# Patient Record
Sex: Female | Born: 1975
Health system: Southern US, Community
[De-identification: ages and names within clinical notes are randomized; demographics above are authoritative.]

## PROBLEM LIST (undated history)

## (undated) DIAGNOSIS — T7840XA Allergy, unspecified, initial encounter: Secondary | ICD-10-CM

## (undated) DIAGNOSIS — F319 Bipolar disorder, unspecified: Secondary | ICD-10-CM

## (undated) DIAGNOSIS — K439 Ventral hernia without obstruction or gangrene: Secondary | ICD-10-CM

## (undated) DIAGNOSIS — E785 Hyperlipidemia, unspecified: Secondary | ICD-10-CM

## (undated) DIAGNOSIS — F32A Depression, unspecified: Secondary | ICD-10-CM

## (undated) DIAGNOSIS — F329 Major depressive disorder, single episode, unspecified: Secondary | ICD-10-CM

## (undated) HISTORY — DX: Bipolar disorder, unspecified: F31.9

## (undated) HISTORY — DX: Hyperlipidemia, unspecified: E78.5

## (undated) HISTORY — DX: Depression, unspecified: F32.A

## (undated) HISTORY — DX: Allergy, unspecified, initial encounter: T78.40XA

## (undated) HISTORY — PX: HAND SURGERY: SHX662

## (undated) HISTORY — PX: OTHER SURGICAL HISTORY: SHX169

## (undated) HISTORY — DX: Major depressive disorder, single episode, unspecified: F32.9

---

## 1999-07-22 DIAGNOSIS — L719 Rosacea, unspecified: Secondary | ICD-10-CM | POA: Insufficient documentation

## 2000-11-09 ENCOUNTER — Other Ambulatory Visit: Admission: RE | Admit: 2000-11-09 | Discharge: 2000-11-09 | Payer: Self-pay | Admitting: *Deleted

## 2000-11-09 DIAGNOSIS — F329 Major depressive disorder, single episode, unspecified: Secondary | ICD-10-CM | POA: Insufficient documentation

## 2000-11-09 DIAGNOSIS — F32A Depression, unspecified: Secondary | ICD-10-CM | POA: Insufficient documentation

## 2006-06-02 DIAGNOSIS — F319 Bipolar disorder, unspecified: Secondary | ICD-10-CM | POA: Insufficient documentation

## 2008-05-13 ENCOUNTER — Ambulatory Visit: Payer: Self-pay | Admitting: Internal Medicine

## 2008-05-22 ENCOUNTER — Ambulatory Visit: Payer: Self-pay | Admitting: Internal Medicine

## 2008-06-10 ENCOUNTER — Ambulatory Visit: Payer: Self-pay | Admitting: Internal Medicine

## 2008-08-10 ENCOUNTER — Ambulatory Visit: Payer: Self-pay | Admitting: Internal Medicine

## 2008-09-04 ENCOUNTER — Ambulatory Visit: Payer: Self-pay | Admitting: Internal Medicine

## 2008-09-10 ENCOUNTER — Ambulatory Visit: Payer: Self-pay | Admitting: Internal Medicine

## 2008-10-10 ENCOUNTER — Ambulatory Visit: Payer: Self-pay | Admitting: Internal Medicine

## 2008-12-11 ENCOUNTER — Ambulatory Visit: Payer: Self-pay | Admitting: Internal Medicine

## 2008-12-26 ENCOUNTER — Ambulatory Visit: Payer: Self-pay | Admitting: Internal Medicine

## 2009-01-10 ENCOUNTER — Ambulatory Visit: Payer: Self-pay | Admitting: Internal Medicine

## 2009-06-10 ENCOUNTER — Ambulatory Visit: Payer: Self-pay | Admitting: Internal Medicine

## 2009-06-26 ENCOUNTER — Ambulatory Visit: Payer: Self-pay | Admitting: Internal Medicine

## 2009-07-11 ENCOUNTER — Ambulatory Visit: Payer: Self-pay | Admitting: Internal Medicine

## 2009-12-11 ENCOUNTER — Ambulatory Visit: Payer: Self-pay | Admitting: Internal Medicine

## 2009-12-31 ENCOUNTER — Ambulatory Visit: Payer: Self-pay | Admitting: Internal Medicine

## 2010-01-10 ENCOUNTER — Ambulatory Visit: Payer: Self-pay | Admitting: Internal Medicine

## 2011-01-01 ENCOUNTER — Ambulatory Visit: Payer: Self-pay | Admitting: Internal Medicine

## 2011-01-11 ENCOUNTER — Ambulatory Visit: Payer: Self-pay | Admitting: Internal Medicine

## 2014-03-01 ENCOUNTER — Ambulatory Visit: Payer: BC Managed Care – PPO

## 2014-03-01 ENCOUNTER — Ambulatory Visit (INDEPENDENT_AMBULATORY_CARE_PROVIDER_SITE_OTHER): Payer: BC Managed Care – PPO | Admitting: Podiatry

## 2014-03-01 ENCOUNTER — Ambulatory Visit (INDEPENDENT_AMBULATORY_CARE_PROVIDER_SITE_OTHER): Payer: BC Managed Care – PPO

## 2014-03-01 ENCOUNTER — Encounter: Payer: Self-pay | Admitting: Podiatry

## 2014-03-01 VITALS — BP 113/70 | HR 60 | Resp 16 | Ht 66.0 in | Wt 138.0 lb

## 2014-03-01 DIAGNOSIS — L600 Ingrowing nail: Secondary | ICD-10-CM

## 2014-03-01 DIAGNOSIS — M779 Enthesopathy, unspecified: Secondary | ICD-10-CM

## 2014-03-01 DIAGNOSIS — M7660 Achilles tendinitis, unspecified leg: Secondary | ICD-10-CM

## 2014-03-01 NOTE — Patient Instructions (Addendum)
ANTIBACTERIAL SOAP INSTRUCTIONS  THE DAY AFTER PROCEDURE  Please follow the instructions your doctor has marked.   Shower as usual. Before getting out, place a drop of antibacterial liquid soap (Dial) on a wet, clean washcloth.  Gently wipe washcloth over affected area.  Afterward, rinse the area with warm water.  Blot the area dry with a soft cloth and cover with antibiotic ointment (neosporin, polysporin, bacitracin) and band aid or gauze and tape  Place 3-4 drops of antibacterial liquid soap in a quart of warm tap water.  Submerge foot into water for 20 minutes.  If bandage was applied after your procedure, leave on to allow for easy lift off, then remove and continue with soak for the remaining time.  Next, blot area dry with a soft cloth and cover with a bandage.  Apply other medications as directed by your doctor, such as cortisporin otic solution (eardrops) or neosporin antibiotic ointment  Achilles Tendinitis  with Rehab Achilles tendinitis is a disorder of the Achilles tendon. The Achilles tendon connects the large calf muscles (Gastrocnemius and Soleus) to the heel bone (calcaneus). This tendon is sometimes called the heel cord. It is important for pushing-off and standing on your toes and is important for walking, running, or jumping. Tendinitis is often caused by overuse and repetitive microtrauma. SYMPTOMS  Pain, tenderness, swelling, warmth, and redness may occur over the Achilles tendon even at rest.  Pain with pushing off, or flexing or extending the ankle.  Pain that is worsened after or during activity. CAUSES   Overuse sometimes seen with rapid increase in exercise programs or in sports requiring running and jumping.  Poor physical conditioning (strength and flexibility or endurance).  Running sports, especially training running down hills.  Inadequate warm-up before practice or play or failure to stretch before participation.  Injury to the tendon. PREVENTION    Warm up and stretch before practice or competition.  Allow time for adequate rest and recovery between practices and competition.  Keep up conditioning.  Keep up ankle and leg flexibility.  Improve or keep muscle strength and endurance.  Improve cardiovascular fitness.  Use proper technique.  Use proper equipment (shoes, skates).  To help prevent recurrence, taping, protective strapping, or an adhesive bandage may be recommended for several weeks after healing is complete. PROGNOSIS   Recovery may take weeks to several months to heal.  Longer recovery is expected if symptoms have been prolonged.  Recovery is usually quicker if the inflammation is due to a direct blow as compared with overuse or sudden strain. RELATED COMPLICATIONS   Healing time will be prolonged if the condition is not correctly treated. The injury must be given plenty of time to heal.  Symptoms can reoccur if activity is resumed too soon.  Untreated, tendinitis may increase the risk of tendon rupture requiring additional time for recovery and possibly surgery. TREATMENT   The first treatment consists of rest anti-inflammatory medication, and ice to relieve the pain.  Stretching and strengthening exercises after resolution of pain will likely help reduce the risk of recurrence. Referral to a physical therapist or athletic trainer for further evaluation and treatment may be helpful.  A walking boot or cast may be recommended to rest the Achilles tendon. This can help break the cycle of inflammation and microtrauma.  Arch supports (orthotics) may be prescribed or recommended by your caregiver as an adjunct to therapy and rest.  Surgery to remove the inflamed tendon lining or degenerated tendon tissue is rarely necessary  and has shown less than predictable results. MEDICATION   Nonsteroidal anti-inflammatory medications, such as aspirin and ibuprofen, may be used for pain and inflammation relief. Do not  take within 7 days before surgery. Take these as directed by your caregiver. Contact your caregiver immediately if any bleeding, stomach upset, or signs of allergic reaction occur. Other minor pain relievers, such as acetaminophen, may also be used.  Pain relievers may be prescribed as necessary by your caregiver. Do not take prescription pain medication for longer than 4 to 7 days. Use only as directed and only as much as you need.  Cortisone injections are rarely indicated. Cortisone injections may weaken tendons and predispose to rupture. It is better to give the condition more time to heal than to use them. HEAT AND COLD  Cold is used to relieve pain and reduce inflammation for acute and chronic Achilles tendinitis. Cold should be applied for 10 to 15 minutes every 2 to 3 hours for inflammation and pain and immediately after any activity that aggravates your symptoms. Use ice packs or an ice massage.  Heat may be used before performing stretching and strengthening activities prescribed by your caregiver. Use a heat pack or a warm soak. SEEK MEDICAL CARE IF:  Symptoms get worse or do not improve in 2 weeks despite treatment.  New, unexplained symptoms develop. Drugs used in treatment may produce side effects.  EXERCISES:  RANGE OF MOTION (ROM) AND STRETCHING EXERCISES - Achilles Tendinitis  These exercises may help you when beginning to rehabilitate your injury. Your symptoms may resolve with or without further involvement from your physician, physical therapist or athletic trainer. While completing these exercises, remember:   Restoring tissue flexibility helps normal motion to return to the joints. This allows healthier, less painful movement and activity.  An effective stretch should be held for at least 30 seconds.  A stretch should never be painful. You should only feel a gentle lengthening or release in the stretched tissue.  STRETCH  Gastroc, Standing   Place hands on  wall.  Extend right / left leg, keeping the front knee somewhat bent.  Slightly point your toes inward on your back foot.  Keeping your right / left heel on the floor and your knee straight, shift your weight toward the wall, not allowing your back to arch.  You should feel a gentle stretch in the right / left calf. Hold this position for 10 seconds. Repeat 3 times. Complete this stretch 2 times per day.  STRETCH  Soleus, Standing   Place hands on wall.  Extend right / left leg, keeping the other knee somewhat bent.  Slightly point your toes inward on your back foot.  Keep your right / left heel on the floor, bend your back knee, and slightly shift your weight over the back leg so that you feel a gentle stretch deep in your back calf.  Hold this position for 10 seconds. Repeat 3 times. Complete this stretch 2 times per day.  STRETCH  Gastrocsoleus, Standing  Note: This exercise can place a lot of stress on your foot and ankle. Please complete this exercise only if specifically instructed by your caregiver.   Place the ball of your right / left foot on a step, keeping your other foot firmly on the same step.  Hold on to the wall or a rail for balance.  Slowly lift your other foot, allowing your body weight to press your heel down over the edge of the step.  You should feel a stretch in your right / left calf.  Hold this position for 10 seconds.  Repeat this exercise with a slight bend in your knee. Repeat 3 times. Complete this stretch 2 times per day.   STRENGTHENING EXERCISES - Achilles Tendinitis These exercises may help you when beginning to rehabilitate your injury. They may resolve your symptoms with or without further involvement from your physician, physical therapist or athletic trainer. While completing these exercises, remember:   Muscles can gain both the endurance and the strength needed for everyday activities through controlled exercises.  Complete these  exercises as instructed by your physician, physical therapist or athletic trainer. Progress the resistance and repetitions only as guided.  You may experience muscle soreness or fatigue, but the pain or discomfort you are trying to eliminate should never worsen during these exercises. If this pain does worsen, stop and make certain you are following the directions exactly. If the pain is still present after adjustments, discontinue the exercise until you can discuss the trouble with your clinician.  STRENGTH - Plantar-flexors   Sit with your right / left leg extended. Holding onto both ends of a rubber exercise band/tubing, loop it around the ball of your foot. Keep a slight tension in the band.  Slowly push your toes away from you, pointing them downward.  Hold this position for 10 seconds. Return slowly, controlling the tension in the band/tubing. Repeat 3 times. Complete this exercise 2 times per day.   STRENGTH - Plantar-flexors   Stand with your feet shoulder width apart. Steady yourself with a wall or table using as little support as needed.  Keeping your weight evenly spread over the width of your feet, rise up on your toes.*  Hold this position for 10 seconds. Repeat 3 times. Complete this exercise 2 times per day.  *If this is too easy, shift your weight toward your right / left leg until you feel challenged. Ultimately, you may be asked to do this exercise with your right / left foot only.  STRENGTH  Plantar-flexors, Eccentric  Note: This exercise can place a lot of stress on your foot and ankle. Please complete this exercise only if specifically instructed by your caregiver.   Place the balls of your feet on a step. With your hands, use only enough support from a wall or rail to keep your balance.  Keep your knees straight and rise up on your toes.  Slowly shift your weight entirely to your right / left toes and pick up your opposite foot. Gently and with controlled movement,  lower your weight through your right / left foot so that your heel drops below the level of the step. You will feel a slight stretch in the back of your calf at the end position.  Use the healthy leg to help rise up onto the balls of both feet, then lower weight only on the right / left leg again. Build up to 15 repetitions. Then progress to 3 consecutive sets of 15 repetitions.*  After completing the above exercise, complete the same exercise with a slight knee bend (about 30 degrees). Again, build up to 15 repetitions. Then progress to 3 consecutive sets of 15 repetitions.* Perform this exercise 2 times per day.  *When you easily complete 3 sets of 15, your physician, physical therapist or athletic trainer may advise you to add resistance by wearing a backpack filled with additional weight.  STRENGTH - Plantar Flexors, Seated   Sit on a  chair that allows your feet to rest flat on the ground. If necessary, sit at the edge of the chair.  Keeping your toes firmly on the ground, lift your right / left heel as far as you can without increasing any discomfort in your ankle. Repeat 3 times. Complete this exercise 2 times a day.

## 2014-03-01 NOTE — Progress Notes (Signed)
   Subjective:    Patient ID: Rebecca Powers, female    DOB: December 20, 1975, 38 y.o.   MRN: [id number]  HPI Comments: i have 2 spots on the bottom of my left foot. They do not hurt, they feel uncomfortable. Ive had them for 2 months. They are getting bigger. When im walking i feel it. The 2nd toenail on my rt foot keeps coming off. i have lost it 2 - 3 times over a period of 1.5 yrs. It only happens after i do long runs. The last time i ran my race it did not fall off. i dont do anything for my feet.  Foot Pain      Review of Systems  All other systems reviewed and are negative.      Objective:   Physical Exam        Assessment & Plan:

## 2014-03-02 NOTE — Progress Notes (Signed)
Subjective:     Patient ID: Rebecca Powers, female   DOB: Nov 21, 1975, 38 y.o.   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      Objective:   Physical Exam  Constitutional: She is oriented to person, place, and time.  Cardiovascular: Intact distal pulses.   Musculoskeletal: Normal range of motion.  Neurological: She is oriented to person, place, and time.  Skin: Skin is warm.  Nursing note and vitals reviewed.  neurovascular status intact with muscle strength adequate range of motion of the subtalar and midtarsal joint within normal limits. Patient is noted to have small lesions plantar aspect left second metatarsal that are separate and is found to have a damaged discolored nails second right that is loose and painful when pressed from a dorsal direction. She is well oriented 3 has good digital perfusion and no equinus condition was noted     Assessment:     Damaged second nail right with moderate tendinitis-like condition and keratotic lesion sub-second left which are probable porokeratosis    Plan:     H&P and conditions discussed. I do think the consideration is there for removal of the nail and I explained the procedure and risk. She wants this done and today I infiltrated 60 mg Xylocaine Marcaine mixture remove the second nail exposed the bed and applied phenol 3 applications 30 seconds followed by alcohol lavaged and sterile dressings. I also discussed her x-rays and we reviewed moderate bunion deformity and Achilles tendinitis and I gave her instructions on exercises for the Achilles tendon

## 2015-05-30 ENCOUNTER — Encounter: Payer: Self-pay | Admitting: Physician Assistant

## 2015-05-30 ENCOUNTER — Ambulatory Visit (INDEPENDENT_AMBULATORY_CARE_PROVIDER_SITE_OTHER): Payer: BLUE CROSS/BLUE SHIELD | Admitting: Physician Assistant

## 2015-05-30 VITALS — BP 110/80 | HR 79 | Temp 100.4°F | Resp 16 | Wt 141.4 lb

## 2015-05-30 DIAGNOSIS — D696 Thrombocytopenia, unspecified: Secondary | ICD-10-CM | POA: Insufficient documentation

## 2015-05-30 DIAGNOSIS — J309 Allergic rhinitis, unspecified: Secondary | ICD-10-CM | POA: Insufficient documentation

## 2015-05-30 DIAGNOSIS — A084 Viral intestinal infection, unspecified: Secondary | ICD-10-CM

## 2015-05-30 DIAGNOSIS — E559 Vitamin D deficiency, unspecified: Secondary | ICD-10-CM | POA: Insufficient documentation

## 2015-05-30 DIAGNOSIS — N920 Excessive and frequent menstruation with regular cycle: Secondary | ICD-10-CM | POA: Insufficient documentation

## 2015-05-30 DIAGNOSIS — R110 Nausea: Secondary | ICD-10-CM | POA: Diagnosis not present

## 2015-05-30 DIAGNOSIS — R6889 Other general symptoms and signs: Secondary | ICD-10-CM

## 2015-05-30 DIAGNOSIS — E7800 Pure hypercholesterolemia, unspecified: Secondary | ICD-10-CM | POA: Insufficient documentation

## 2015-05-30 HISTORY — DX: Thrombocytopenia, unspecified: D69.6

## 2015-05-30 LAB — POCT INFLUENZA A/B
Influenza A, POC: NEGATIVE
Influenza B, POC: NEGATIVE

## 2015-05-30 MED ORDER — ONDANSETRON HCL 4 MG PO TABS
4.0000 mg | ORAL_TABLET | Freq: Three times a day (TID) | ORAL | Status: DC | PRN
Start: 2015-05-30 — End: 2017-06-02

## 2015-05-30 NOTE — Progress Notes (Signed)
Patient: Rebecca Powers Female    DOB: [birth year]   40 y.o.   MRN: [id number] Visit Date: 05/30/2015  Today's Provider: Mar Daring, PA-C   Chief Complaint  Patient presents with  . Emesis  . Diarrhea   Subjective:    HPI  Rebecca Powers is here today c/o Vomiting,diarrhea-watery went to the bathroom five to six times last night. Vomiting and diarrhea is gone since this morning. Feeling fatigue, low grade fever-did not check temperature last night but did this morning and it was at 99.0,chills and aching all over. She took Ibuprofen at 2 pm before coming to the appointment. No sick contact that she know off. Recently returned yesterday from a trip to [place], [place]. Had been on many planes and spent 7 days at a conference.     No Known Allergies Previous Medications   AZELAIC ACID (FINACEA) 15 % CREAM    Apply topically daily. After skin is thoroughly washed and patted dry, gently but thoroughly massage a thin film of azelaic acid cream into the affected area twice daily, in the morning and evening.   BRIMONIDINE TARTRATE (MIRVASO) 0.33 % GEL    Apply topically daily.   LAMOTRIGINE (LAMICTAL) 100 MG TABLET       PREVIFEM 0.25-35 MG-MCG TABLET        Review of Systems  Constitutional: Positive for fever, chills and fatigue.  HENT: Negative.  Negative for congestion, postnasal drip, rhinorrhea and sinus pressure.   Eyes: Negative.   Respiratory: Negative.  Negative for cough, chest tightness, shortness of breath and wheezing.   Cardiovascular: Negative for chest pain, palpitations and leg swelling.  Musculoskeletal: Positive for myalgias.  Neurological: Negative for dizziness and headaches.    Social History  Substance Use Topics  . Smoking status: Never Smoker   . Smokeless tobacco: Not on file  . Alcohol Use: No   Objective:   BP 110/80 mmHg  Pulse 79  Temp(Src) 100.4 F (38 C) (Oral)  Resp 16  Wt 141 lb 6.4 oz (64.139 kg)  SpO2 99%  LMP  05/05/2015  Physical Exam  Constitutional: She is oriented to person, place, and time. She appears well-developed and well-nourished. No distress.  Cardiovascular: Normal rate, regular rhythm and normal heart sounds.  Exam reveals no gallop and no friction rub.   No murmur heard. Pulmonary/Chest: Effort normal and breath sounds normal. No respiratory distress. She has no wheezes. She has no rales.  Abdominal: Soft. Normal appearance and bowel sounds are normal. She exhibits no distension and no mass. There is no hepatosplenomegaly. There is generalized tenderness. There is no rebound, no guarding and no CVA tenderness.  Suprapubic tenderness  Neurological: She is alert and oriented to person, place, and time.  Skin: Skin is warm and dry. She is not diaphoretic.        Assessment & Plan:     1. Flu-like symptoms Flu test was negative. - POCT Influenza A/B  2. Viral gastroenteritis Improving. Will give zofran as needed for nausea as below in case it returns.  Advised to stay well hydrated. She is to call the office if symptoms do not improve or worsen. - ondansetron (ZOFRAN) 4 MG tablet; Take 1 tablet (4 mg total) by mouth every 8 (eight) hours as needed for nausea or vomiting.  Dispense: 20 tablet; Refill: 0  3. Nausea See above medical treatment plan. - ondansetron (ZOFRAN) 4 MG tablet; Take 1 tablet (4 mg total) by mouth  every 8 (eight) hours as needed for nausea or vomiting.  Dispense: 20 tablet; Refill: 0       Mar Daring, PA-C  [facility]

## 2015-05-30 NOTE — Patient Instructions (Signed)

## 2015-06-23 ENCOUNTER — Other Ambulatory Visit: Payer: Self-pay | Admitting: Family Medicine

## 2015-06-23 DIAGNOSIS — N921 Excessive and frequent menstruation with irregular cycle: Secondary | ICD-10-CM

## 2015-09-22 DIAGNOSIS — F3181 Bipolar II disorder: Secondary | ICD-10-CM | POA: Diagnosis not present

## 2015-12-20 DIAGNOSIS — F3181 Bipolar II disorder: Secondary | ICD-10-CM | POA: Diagnosis not present

## 2015-12-30 DIAGNOSIS — L814 Other melanin hyperpigmentation: Secondary | ICD-10-CM | POA: Diagnosis not present

## 2015-12-30 DIAGNOSIS — L718 Other rosacea: Secondary | ICD-10-CM | POA: Diagnosis not present

## 2015-12-30 DIAGNOSIS — Z1283 Encounter for screening for malignant neoplasm of skin: Secondary | ICD-10-CM | POA: Diagnosis not present

## 2015-12-30 DIAGNOSIS — D229 Melanocytic nevi, unspecified: Secondary | ICD-10-CM | POA: Diagnosis not present

## 2016-06-18 ENCOUNTER — Telehealth: Payer: Self-pay

## 2016-06-18 DIAGNOSIS — Z3041 Encounter for surveillance of contraceptive pills: Secondary | ICD-10-CM

## 2016-06-18 MED ORDER — NORGESTIMATE-ETH ESTRADIOL 0.25-35 MG-MCG PO TABS: ORAL_TABLET | ORAL | 3 refills | Status: DC

## 2016-06-18 NOTE — Telephone Encounter (Signed)
Previfem birth control refilled and sent to [facility]

## 2016-06-18 NOTE — Telephone Encounter (Signed)
[facility] mail service pharmacy requesting refill for [facility] tablets 28S Last ov 05/30/15  Please review. Thank you. sd

## 2016-09-13 DIAGNOSIS — F3181 Bipolar II disorder: Secondary | ICD-10-CM | POA: Diagnosis not present

## 2016-09-15 ENCOUNTER — Other Ambulatory Visit: Payer: Self-pay

## 2016-09-15 DIAGNOSIS — Z0000 Encounter for general adult medical examination without abnormal findings: Secondary | ICD-10-CM

## 2016-09-15 LAB — POCT URINALYSIS DIPSTICK
BILIRUBIN UA: NEGATIVE
GLUCOSE UA: NEGATIVE
Ketones, UA: NEGATIVE
Leukocytes, UA: NEGATIVE
Nitrite, UA: NEGATIVE
RBC UA: NEGATIVE
SPEC GRAV UA: 1.01 (ref 1.010–1.025)
Urobilinogen, UA: 0.2 E.U./dL
pH, UA: 6.5 (ref 5.0–8.0)

## 2016-09-16 LAB — CMP12+LP+TP+TSH+6AC+CBC/D/PLT
ALT: 17 IU/L (ref 0–32)
AST: 22 IU/L (ref 0–40)
Albumin/Globulin Ratio: 1.5 (ref 1.2–2.2)
Albumin: 4.2 g/dL (ref 3.5–5.5)
Alkaline Phosphatase: 41 IU/L (ref 39–117)
BASOS ABS: 0 10*3/uL (ref 0.0–0.2)
BILIRUBIN TOTAL: 0.5 mg/dL (ref 0.0–1.2)
BUN/Creatinine Ratio: 11 (ref 9–23)
BUN: 10 mg/dL (ref 6–24)
Basos: 0 %
CHOLESTEROL TOTAL: 212 mg/dL — AB (ref 100–199)
Calcium: 9.4 mg/dL (ref 8.7–10.2)
Chloride: 102 mmol/L (ref 96–106)
Chol/HDL Ratio: 3 ratio (ref 0.0–4.4)
Creatinine, Ser: 0.94 mg/dL (ref 0.57–1.00)
EOS (ABSOLUTE): 0.1 10*3/uL (ref 0.0–0.4)
Eos: 1 %
Estimated CHD Risk: <0.5 times avg. (ref 0.0–1.0)
FREE THYROXINE INDEX: 1.9 (ref 1.2–4.9)
GFR calc non Af Amer: 76 mL/min/{1.73_m2} (ref >59)
GFR, EST AFRICAN AMERICAN: 88 mL/min/{1.73_m2} (ref >59)
GGT: 39 IU/L (ref 0–60)
GLOBULIN, TOTAL: 2.8 g/dL (ref 1.5–4.5)
GLUCOSE: 78 mg/dL (ref 65–99)
HDL: 70 mg/dL (ref >39)
HEMOGLOBIN: 13.5 g/dL (ref 11.1–15.9)
Hematocrit: 41.5 % (ref 34.0–46.6)
IMMATURE GRANULOCYTES: 0 %
IRON: 101 ug/dL (ref 27–159)
Immature Grans (Abs): 0 10*3/uL (ref 0.0–0.1)
LDH: 156 IU/L (ref 119–226)
LDL Calculated: 120 mg/dL — ABNORMAL HIGH (ref 0–99)
LYMPHS: 26 %
Lymphocytes Absolute: 1.3 10*3/uL (ref 0.7–3.1)
MCH: 30.9 pg (ref 26.6–33.0)
MCHC: 32.5 g/dL (ref 31.5–35.7)
MCV: 95 fL (ref 79–97)
MONOCYTES: 6 %
Monocytes Absolute: 0.3 10*3/uL (ref 0.1–0.9)
NEUTROS PCT: 67 %
Neutrophils Absolute: 3.3 10*3/uL (ref 1.4–7.0)
PHOSPHORUS: 3.4 mg/dL (ref 2.5–4.5)
PLATELETS: 168 10*3/uL (ref 150–379)
Potassium: 4.5 mmol/L (ref 3.5–5.2)
RBC: 4.37 x10E6/uL (ref 3.77–5.28)
RDW: 12.7 % (ref 12.3–15.4)
Sodium: 140 mmol/L (ref 134–144)
T3 UPTAKE RATIO: 20 % — AB (ref 24–39)
T4 TOTAL: 9.4 ug/dL (ref 4.5–12.0)
TOTAL PROTEIN: 7 g/dL (ref 6.0–8.5)
TRIGLYCERIDES: 109 mg/dL (ref 0–149)
TSH: 4.88 u[IU]/mL — ABNORMAL HIGH (ref 0.450–4.500)
Uric Acid: 4.4 mg/dL (ref 2.5–7.1)
VLDL CHOLESTEROL CAL: 22 mg/dL (ref 5–40)
WBC: 4.9 10*3/uL (ref 3.4–10.8)

## 2016-09-16 LAB — VITAMIN D 25 HYDROXY (VIT D DEFICIENCY, FRACTURES): Vit D, 25-Hydroxy: 34.7 ng/mL (ref 30.0–100.0)

## 2016-10-05 ENCOUNTER — Encounter: Payer: Self-pay | Admitting: Medical

## 2016-10-05 ENCOUNTER — Ambulatory Visit: Payer: Self-pay | Admitting: Medical

## 2016-10-05 VITALS — BP 94/64 | HR 69 | Temp 99.0°F | Resp 16 | Ht 66.0 in | Wt 140.0 lb

## 2016-10-05 DIAGNOSIS — Z23 Encounter for immunization: Secondary | ICD-10-CM

## 2016-10-05 DIAGNOSIS — Z0000 Encounter for general adult medical examination without abnormal findings: Secondary | ICD-10-CM

## 2016-10-05 DIAGNOSIS — E559 Vitamin D deficiency, unspecified: Secondary | ICD-10-CM

## 2016-10-05 NOTE — Progress Notes (Signed)
Subjective:    Patient ID: Rebecca Powers, female    DOB: 17-Sep-1975, 41 y.o.   MRN: [id number]  HPI Here for primary care appointment , no complaints today.  Previously seeing Margarita Rana at [facility].  Sees Dr. Ria Clock at the [facility] who manages her Lamictal. Single , currently building her a new house would like her Tdap updated.   Review of Systems  Constitutional: Negative.   HENT: Positive for postnasal drip.   Eyes: Negative.   Respiratory: Negative.   Cardiovascular: Negative.   Gastrointestinal: Negative.   Endocrine: Negative.   Genitourinary: Negative.   Musculoskeletal: Negative.   Skin: Negative.   Allergic/Immunologic: Positive for environmental allergies. Negative for food allergies and immunocompromised state.  Neurological: Negative.   Hematological: Negative.   Psychiatric/Behavioral: Negative.    Diagnosed with thrombocytopenia though current labs are low normal.  Right index base of finger cyst on volar surface. Patient currently does not want to see any specialtist at this time. Objective:   Physical Exam  Constitutional: She is oriented to person, place, and time. Vital signs are normal. She appears well-developed and well-nourished.  HENT:  Head: Normocephalic and atraumatic.  Right Ear: Hearing, external ear and ear canal normal. A middle ear effusion is present.  Left Ear: Hearing, external ear and ear canal normal. A middle ear effusion is present.  Nose: Nose normal.  Mouth/Throat: Uvula is midline, oropharynx is clear and moist and mucous membranes are normal.  Eyes: Conjunctivae, EOM and lids are normal. Pupils are equal, round, and reactive to light.  Fundoscopic exam:      The right eye shows red reflex.       The left eye shows red reflex.  Neck: Trachea normal and normal range of motion. Neck supple. No thyromegaly present.  Cardiovascular: Normal rate, regular rhythm, normal heart sounds and intact distal pulses.  Exam  reveals no gallop and no friction rub.   No murmur heard. Pulmonary/Chest: Effort normal and breath sounds normal. Right breast exhibits no inverted nipple, no mass, no nipple discharge and no skin change. Left breast exhibits no inverted nipple, no mass, no nipple discharge, no skin change and no tenderness. Breasts are symmetrical.  Abdominal: Soft. Normal aorta and bowel sounds are normal. There is no hepatosplenomegaly.  Musculoskeletal: Normal range of motion.  Lymphadenopathy:       Head (right side): No submental, no submandibular, no tonsillar, no preauricular, no posterior auricular and no occipital adenopathy present.       Head (left side): No submental, no submandibular, no tonsillar, no preauricular, no posterior auricular and no occipital adenopathy present.    She has no cervical adenopathy.       Right cervical: No superficial cervical, no deep cervical and no posterior cervical adenopathy present.      Left cervical: No superficial cervical, no deep cervical and no posterior cervical adenopathy present.       Right axillary: No pectoral and no lateral adenopathy present.       Left axillary: No pectoral and no lateral adenopathy present.      Right: No supraclavicular adenopathy present.       Left: No supraclavicular adenopathy present.  Neurological: She is alert and oriented to person, place, and time. She has normal strength and normal reflexes. She displays a negative Romberg sign. GCS eye subscore is 4. GCS verbal subscore is 5. GCS motor subscore is 6.  Reflex Scores:      Brachioradialis  reflexes are 2+ on the right side and 2+ on the left side.      Patellar reflexes are 2+ on the right side and 2+ on the left side.      Achilles reflexes are 2+ on the right side and 2+ on the left side. Skin: Skin is warm, dry and intact.  Psychiatric: She has a normal mood and affect. Her speech is normal and behavior is normal. Judgment and thought content normal. Cognition and  memory are normal.  Nursing note and vitals reviewed.  Breast exam performed. wnl.  Right hand base of index finger on volar surface is a cyst. Currently she does not wan to see a specialist about it. When she is ready to please contact me for referral.     Assessment & Plan:  Primary care appointment. Elevated cholesterol and  LDL , counseled patient on diet and exercise, she feels she eats healthy. Slightly Elevated TSH will monitor recheck in  3 months, patient denies any fatigue. Vitamin D low recommended OTC Vitamin D3 1000 IU-2000 IU/day recheck in  3 months and sunshine if possible 15 minutes a day then use sunscreen and a hat. She runs and now that the weather is better she will be doing more running. Tdap updated.  Return to the clinic as needed.

## 2017-01-10 DIAGNOSIS — Z1283 Encounter for screening for malignant neoplasm of skin: Secondary | ICD-10-CM | POA: Diagnosis not present

## 2017-01-10 DIAGNOSIS — L718 Other rosacea: Secondary | ICD-10-CM | POA: Diagnosis not present

## 2017-01-10 DIAGNOSIS — D229 Melanocytic nevi, unspecified: Secondary | ICD-10-CM | POA: Diagnosis not present

## 2017-01-10 DIAGNOSIS — D1800 Hemangioma unspecified site: Secondary | ICD-10-CM | POA: Diagnosis not present

## 2017-03-31 DIAGNOSIS — F3181 Bipolar II disorder: Secondary | ICD-10-CM | POA: Diagnosis not present

## 2017-04-17 ENCOUNTER — Other Ambulatory Visit: Payer: Self-pay | Admitting: Physician Assistant

## 2017-04-17 DIAGNOSIS — Z3041 Encounter for surveillance of contraceptive pills: Secondary | ICD-10-CM

## 2017-04-22 ENCOUNTER — Telehealth: Payer: Self-pay

## 2017-04-22 NOTE — Telephone Encounter (Signed)
Contacted and spoke with patient about her request for a  refill on her birth control pill.  Patient unsure about her last annual pelvic exam but she will contact [facility] today to schedule an appointment. Once patient makes an appointment she is to let us know and at that point we will give her a 30 day bridge if she needs it. Pt  verbalizes understanding of instructions.

## 2017-05-30 ENCOUNTER — Ambulatory Visit: Payer: Self-pay | Admitting: Physician Assistant

## 2017-06-02 ENCOUNTER — Encounter: Payer: Self-pay | Admitting: Physician Assistant

## 2017-06-02 ENCOUNTER — Ambulatory Visit (INDEPENDENT_AMBULATORY_CARE_PROVIDER_SITE_OTHER): Payer: BLUE CROSS/BLUE SHIELD | Admitting: Physician Assistant

## 2017-06-02 ENCOUNTER — Other Ambulatory Visit: Payer: Self-pay

## 2017-06-02 VITALS — BP 110/70 | HR 74 | Temp 98.7°F | Resp 16 | Ht 66.0 in | Wt 144.8 lb

## 2017-06-02 DIAGNOSIS — Z1231 Encounter for screening mammogram for malignant neoplasm of breast: Secondary | ICD-10-CM | POA: Diagnosis not present

## 2017-06-02 DIAGNOSIS — Z3041 Encounter for surveillance of contraceptive pills: Secondary | ICD-10-CM

## 2017-06-02 DIAGNOSIS — Z1239 Encounter for other screening for malignant neoplasm of breast: Secondary | ICD-10-CM

## 2017-06-02 DIAGNOSIS — M67441 Ganglion, right hand: Secondary | ICD-10-CM

## 2017-06-02 MED ORDER — NORGESTIMATE-ETH ESTRADIOL 0.25-35 MG-MCG PO TABS: ORAL_TABLET | ORAL | 3 refills | Status: DC

## 2017-06-02 NOTE — Progress Notes (Signed)
Patient: Rebecca Powers, Female    DOB: Sep 04, 1975, 42 y.o.   MRN: [id number] Visit Date: 06/02/2017  Today's Provider: Mar Daring, PA-C   Chief Complaint  Patient presents with  . Annual Exam  . Discuss Birth Control   Subjective:    Annual physical exam Rebecca Powers is a 42 y.o. female who presents today for health maintenance and complete physical. She feels well. She reports exercising. She reports she is sleeping well.  Patient also here for refills on her birth control. She is not sexually active, nor has she ever been. She is awaiting marriage for sexual relationships.   She has never had a mammogram. No family history of breast cancer. Patient had breast exam in June 2018.  She also has a nodule on the right 4th finger MCP joint. Noticed a while back that has been increasing in size recently and now starting to cause pain.  -----------------------------------------------------------------   Review of Systems  Constitutional: Negative.   HENT: Positive for postnasal drip.   Eyes: Negative.   Respiratory: Negative.   Cardiovascular: Negative.   Gastrointestinal: Negative.   Endocrine: Negative.   Genitourinary: Negative.   Musculoskeletal: Negative.   Skin: Negative.   Allergic/Immunologic: Negative.   Neurological: Negative.   Hematological: Negative.   Psychiatric/Behavioral: Negative.     Social History      She  reports that  has never smoked. she has never used smokeless tobacco. She reports that she does not drink alcohol or use drugs.       Social History   Socioeconomic History  . Marital status: Single    Spouse name: None  . Number of children: None  . Years of education: None  . Highest education level: None  Social Needs  . Financial resource strain: None  . Food insecurity - worry: None  . Food insecurity - inability: None  . Transportation needs - medical: None  . Transportation needs - non-medical: None  Occupational  History  . None  Tobacco Use  . Smoking status: Never Smoker  . Smokeless tobacco: Never Used  Substance and Sexual Activity  . Alcohol use: No    Alcohol/week: 0.0 oz  . Drug use: No  . Sexual activity: No    Birth control/protection: Pill    Comment: ovarian cyst and ,excess bleeding mensis  Other Topics Concern  . None  Social History Narrative  . None    Past Medical History:  Diagnosis Date  . Allergy   . Bipolar disorder ([place])    Followed by Dr. Sheryle Hail  . Depression   . Hyperlipidemia      Patient Active Problem List   Diagnosis Date Noted  . Allergic rhinitis 05/30/2015  . Excess, menstruation 05/30/2015  . Pure hypercholesterolemia 05/30/2015  . Thrombocytopenia ([place]) 05/30/2015  . Avitaminosis D 05/30/2015  . Bipolar affective disorder ([place]) 06/02/2006  . Clinical depression 11/09/2000  . Acne erythematosa 07/22/1999    Past Surgical History:  Procedure Laterality Date  . none N/A     Family History        Family Status  Relation Name Status  . MGM  Deceased at age 33       Believed to have died from colon Cancer  . MGF  Deceased       CAD  . PGF  Deceased       parkinsons disease  . Mother  Alive  . Father  Alive  .  PGM  Deceased       Parkinson Disease, gastro issues,   . Brother  Alive  . Mat Aunt  Alive       unknown med hx  . Mat Nordstrom  . Ethlyn Daniels  Alive       unknown health hx  . Annamarie Major  Alive       unknown health hx        Her family history includes Dementia in her maternal grandfather; Heart disease in her maternal grandfather and paternal grandfather; Hyperlipidemia in her maternal grandfather, maternal uncle, mother, and paternal grandfather; Migraines in her brother.      No Known Allergies   Current Outpatient Medications:  .  Brimonidine Tartrate (MIRVASO) 0.33 % GEL, Apply topically daily., Disp: , Rfl:  .  lamoTRIgine (LAMICTAL) 100 MG tablet, , Disp: , Rfl: 2 .  norgestimate-ethinyl estradiol  (PREVIFEM) 0.25-35 MG-MCG tablet, TAKE 1 BY MOUTH DAILY, Disp: 84 tablet, Rfl: 3   Patient Care Team: Mar Daring, PA-C as PCP - General (Family Medicine)      Objective:   Vitals: BP 110/70 (BP Location: Left Arm, Patient Position: Sitting, Cuff Size: Normal)   Pulse 74   Temp 98.7 F (37.1 C) (Oral)   Resp 16   Ht 5\' 6"  (1.676 m)   Wt 144 lb 12.8 oz (65.7 kg)   BMI 23.37 kg/m    Physical Exam  Constitutional: She appears well-developed and well-nourished. No distress.  Neck: Normal range of motion. Neck supple. No JVD present. No tracheal deviation present. No thyromegaly present.  Cardiovascular: Normal rate, regular rhythm and normal heart sounds. Exam reveals no gallop and no friction rub.  No murmur heard. Pulmonary/Chest: Effort normal and breath sounds normal. No respiratory distress. She has no wheezes. She has no rales.  Lymphadenopathy:    She has no cervical adenopathy.  Skin: She is not diaphoretic.  Psychiatric: She has a normal mood and affect. Her behavior is normal. Judgment and thought content normal.  Vitals reviewed.    Depression Screen PHQ 2/9 Scores 06/02/2017  PHQ - 2 Score 0      Assessment & Plan:     Routine Health Maintenance and Physical Exam  Exercise Activities and Dietary recommendations Goals    None      Immunization History  Administered Date(s) Administered  . Hepatitis A 03/21/2009, 09/23/2009  . Influenza-Unspecified 01/03/2017  . Tdap 06/02/2006, 10/05/2016    Health Maintenance  Topic Date Due  . HIV Screening  06/02/1991  . PAP SMEAR  06/01/1997  . TETANUS/TDAP  10/06/2026  . INFLUENZA VACCINE  Completed     Discussed health benefits of physical activity, and encouraged her to engage in regular exercise appropriate for her age and condition.    1. Encounter for surveillance of contraceptive pills Stable. Diagnosis pulled for medication refill. Continue current medical treatment plan. -  norgestimate-ethinyl estradiol (PREVIFEM) 0.25-35 MG-MCG tablet; TAKE 1 BY MOUTH DAILY  Dispense: 84 tablet; Refill: 3  2. Breast cancer screening There is no family history of breast cancer. She does perform regular self breast exams. Mammogram was ordered as below. Information for [facility] was given to patient so she may schedule her mammogram at her convenience. - MM Digital Screening; Future  3. Ganglion cyst of finger of right hand Referral placed for possible ganglion cyst of right 4th finger. May be a nodule on tendon sheath but feels less likely and location more accurate  with ganglion cyst. - Ambulatory referral to Orthopedic Surgery  --------------------------------------------------------------------    Mar Daring, PA-C  [place]

## 2017-06-16 DIAGNOSIS — R2231 Localized swelling, mass and lump, right upper limb: Secondary | ICD-10-CM | POA: Diagnosis not present

## 2017-07-05 DIAGNOSIS — R2231 Localized swelling, mass and lump, right upper limb: Secondary | ICD-10-CM | POA: Diagnosis not present

## 2017-07-29 DIAGNOSIS — F329 Major depressive disorder, single episode, unspecified: Secondary | ICD-10-CM | POA: Diagnosis not present

## 2017-07-29 DIAGNOSIS — R2231 Localized swelling, mass and lump, right upper limb: Secondary | ICD-10-CM | POA: Diagnosis not present

## 2017-07-29 DIAGNOSIS — M67441 Ganglion, right hand: Secondary | ICD-10-CM | POA: Diagnosis not present

## 2017-07-29 DIAGNOSIS — M67449 Ganglion, unspecified hand: Secondary | ICD-10-CM | POA: Diagnosis not present

## 2017-07-29 DIAGNOSIS — R2241 Localized swelling, mass and lump, right lower limb: Secondary | ICD-10-CM | POA: Diagnosis not present

## 2017-09-21 ENCOUNTER — Ambulatory Visit
Admission: RE | Admit: 2017-09-21 | Discharge: 2017-09-21 | Disposition: A | Discharge status: Home / Self Care | Payer: BLUE CROSS/BLUE SHIELD | Source: Ambulatory Visit | Attending: Physician Assistant | Admitting: Physician Assistant

## 2017-09-21 DIAGNOSIS — Z1239 Encounter for other screening for malignant neoplasm of breast: Secondary | ICD-10-CM

## 2017-09-21 DIAGNOSIS — Z1231 Encounter for screening mammogram for malignant neoplasm of breast: Secondary | ICD-10-CM | POA: Insufficient documentation

## 2017-09-22 ENCOUNTER — Other Ambulatory Visit: Payer: Self-pay | Admitting: Physician Assistant

## 2017-09-22 DIAGNOSIS — R928 Other abnormal and inconclusive findings on diagnostic imaging of breast: Secondary | ICD-10-CM

## 2017-09-22 DIAGNOSIS — N6489 Other specified disorders of breast: Secondary | ICD-10-CM

## 2017-10-04 ENCOUNTER — Telehealth: Payer: Self-pay

## 2017-10-04 ENCOUNTER — Ambulatory Visit
Admission: RE | Admit: 2017-10-04 | Discharge: 2017-10-04 | Disposition: A | Discharge status: Home / Self Care | Payer: BLUE CROSS/BLUE SHIELD | Source: Ambulatory Visit | Attending: Physician Assistant | Admitting: Physician Assistant

## 2017-10-04 DIAGNOSIS — N6489 Other specified disorders of breast: Secondary | ICD-10-CM

## 2017-10-04 DIAGNOSIS — R928 Other abnormal and inconclusive findings on diagnostic imaging of breast: Secondary | ICD-10-CM

## 2017-10-04 DIAGNOSIS — R922 Inconclusive mammogram: Secondary | ICD-10-CM | POA: Diagnosis not present

## 2017-10-04 NOTE — Telephone Encounter (Signed)
Viewed by Alex Gardener on 10/04/2017 12:26 PM

## 2017-10-04 NOTE — Telephone Encounter (Signed)
-----   Message from Mar Daring, PA-C sent at 10/04/2017 11:11 AM EDT ----- Normal diagnostic mammogram. Return to normal screenings.

## 2017-10-17 ENCOUNTER — Ambulatory Visit (INDEPENDENT_AMBULATORY_CARE_PROVIDER_SITE_OTHER): Payer: BLUE CROSS/BLUE SHIELD | Admitting: Physician Assistant

## 2017-10-17 ENCOUNTER — Encounter: Payer: Self-pay | Admitting: Physician Assistant

## 2017-10-17 VITALS — BP 108/60 | HR 67 | Temp 98.4°F | Resp 16 | Ht 66.0 in | Wt 142.6 lb

## 2017-10-17 DIAGNOSIS — Z0000 Encounter for general adult medical examination without abnormal findings: Secondary | ICD-10-CM

## 2017-10-17 DIAGNOSIS — E7800 Pure hypercholesterolemia, unspecified: Secondary | ICD-10-CM

## 2017-10-17 DIAGNOSIS — Z124 Encounter for screening for malignant neoplasm of cervix: Secondary | ICD-10-CM | POA: Diagnosis not present

## 2017-10-17 DIAGNOSIS — Z131 Encounter for screening for diabetes mellitus: Secondary | ICD-10-CM

## 2017-10-17 NOTE — Progress Notes (Addendum)
Patient: Rebecca Powers, Female    DOB: March 02, 1976, 42 y.o.   MRN: [id number] Visit Date: 10/17/2017  Today's Provider: Mar Daring, PA-C   Chief Complaint  Patient presents with  . Annual Exam   Subjective:    Annual physical exam Rebecca Powers is a 42 y.o. female who presents today for health maintenance and complete physical. She feels well. She reports exercising 3 times a week. She reports she is sleeping well.  Patient is going to be traveling to [place] Jan 2020 and would like to discuss prescriptions/vaccination.  Review of Systems  Constitutional: Negative.   HENT: Positive for postnasal drip.   Eyes: Negative.   Respiratory: Negative.   Cardiovascular: Negative.   Gastrointestinal: Negative.   Endocrine: Negative.   Genitourinary: Negative.   Musculoskeletal: Negative.   Skin: Negative.   Allergic/Immunologic: Negative.   Neurological: Negative.   Hematological: Negative.   Psychiatric/Behavioral: Negative.     Social History      She  reports that she has never smoked. She has never used smokeless tobacco. She reports that she does not drink alcohol or use drugs.       Social History   Socioeconomic History  . Marital status: Single    Spouse name: Not on file  . Number of children: Not on file  . Years of education: Not on file  . Highest education level: Not on file  Occupational History  . Not on file  Social Needs  . Financial resource strain: Not on file  . Food insecurity:    Worry: Not on file    Inability: Not on file  . Transportation needs:    Medical: Not on file    Non-medical: Not on file  Tobacco Use  . Smoking status: Never Smoker  . Smokeless tobacco: Never Used  Substance and Sexual Activity  . Alcohol use: No    Alcohol/week: 0.0 oz  . Drug use: No  . Sexual activity: Never    Birth control/protection: Pill    Comment: ovarian cyst and ,excess bleeding mensis  Lifestyle  . Physical activity:    Days per  week: Not on file    Minutes per session: Not on file  . Stress: Not on file  Relationships  . Social connections:    Talks on phone: Not on file    Gets together: Not on file    Attends religious service: Not on file    Active member of club or organization: Not on file    Attends meetings of clubs or organizations: Not on file    Relationship status: Not on file  Other Topics Concern  . Not on file  Social History Narrative  . Not on file    Past Medical History:  Diagnosis Date  . Allergy   . Bipolar disorder ([place])    Followed by Dr. Sheryle Hail  . Depression   . Hyperlipidemia      Patient Active Problem List   Diagnosis Date Noted  . Allergic rhinitis 05/30/2015  . Excess, menstruation 05/30/2015  . Pure hypercholesterolemia 05/30/2015  . Thrombocytopenia ([place]) 05/30/2015  . Avitaminosis D 05/30/2015  . Bipolar affective disorder ([place]) 06/02/2006  . Clinical depression 11/09/2000  . Acne erythematosa 07/22/1999    Past Surgical History:  Procedure Laterality Date  . none N/A     Family History        Family Status  Relation Name Status  . MGM  Deceased at  age 3       Believed to have died from colon Cancer  . MGF  Deceased       CAD  . PGF  Deceased       parkinsons disease  . Mother  Alive  . Father  Alive  . PGM  Deceased       Parkinson Disease, gastro issues,   . Brother  Alive  . Mat Aunt  Alive       unknown med hx  . Mat Nordstrom  . Ethlyn Daniels  Alive       unknown health hx  . Annamarie Major  Alive       unknown health hx        Her family history includes Dementia in her maternal grandfather; Heart disease in her maternal grandfather and paternal grandfather; Hyperlipidemia in her maternal grandfather, maternal uncle, mother, and paternal grandfather; Migraines in her brother.      No Known Allergies   Current Outpatient Medications:  .  Brimonidine Tartrate (MIRVASO) 0.33 % GEL, Apply topically daily., Disp: , Rfl:  .   lamoTRIgine (LAMICTAL) 100 MG tablet, , Disp: , Rfl: 2 .  norgestimate-ethinyl estradiol (PREVIFEM) 0.25-35 MG-MCG tablet, TAKE 1 BY MOUTH DAILY, Disp: 84 tablet, Rfl: 3   Patient Care Team: Mar Daring, PA-C as PCP - General (Family Medicine)      Objective:   Vitals: BP 108/60 (BP Location: Left Arm, Patient Position: Sitting, Cuff Size: Normal)   Pulse 67   Temp 98.4 F (36.9 C) (Oral)   Resp 16   Ht 5\' 6"  (1.676 m)   Wt 142 lb 9.6 oz (64.7 kg)   LMP 09/25/2017   BMI 23.02 kg/m    Vitals:   10/17/17 0815  BP: 108/60  Pulse: 67  Resp: 16  Temp: 98.4 F (36.9 C)  TempSrc: Oral  Weight: 142 lb 9.6 oz (64.7 kg)  Height: 5\' 6"  (1.676 m)     Physical Exam  Constitutional: She is oriented to person, place, and time. She appears well-developed and well-nourished. No distress.  HENT:  Head: Normocephalic and atraumatic.  Right Ear: Hearing, tympanic membrane, external ear and ear canal normal.  Left Ear: Hearing, tympanic membrane, external ear and ear canal normal.  Nose: Nose normal.  Mouth/Throat: Uvula is midline, oropharynx is clear and moist and mucous membranes are normal. No oropharyngeal exudate.  Eyes: Pupils are equal, round, and reactive to light. Conjunctivae and EOM are normal. Right eye exhibits no discharge. Left eye exhibits no discharge. No scleral icterus.  Neck: Normal range of motion. Neck supple. No JVD present. Carotid bruit is not present. No tracheal deviation present. No thyromegaly present.  Cardiovascular: Normal rate, regular rhythm, normal heart sounds and intact distal pulses. Exam reveals no gallop and no friction rub.  No murmur heard. Pulmonary/Chest: Effort normal and breath sounds normal. No respiratory distress. She has no wheezes. She has no rales. She exhibits no tenderness. Right breast exhibits no inverted nipple, no mass, no nipple discharge, no skin change and no tenderness. Left breast exhibits no inverted nipple, no mass, no  nipple discharge, no skin change and no tenderness. No breast tenderness, discharge or bleeding. Breasts are symmetrical.  Abdominal: Soft. Bowel sounds are normal. She exhibits no distension and no mass. There is no tenderness. There is no rebound and no guarding. Hernia confirmed negative in the right inguinal area and confirmed negative in the left inguinal area.  Genitourinary:  Rectum normal, vagina normal and uterus normal. No breast tenderness, discharge or bleeding. Pelvic exam was performed with patient supine. There is no rash, tenderness, lesion or injury on the right labia. There is no rash, tenderness, lesion or injury on the left labia. Cervix exhibits no motion tenderness, no discharge and no friability. Right adnexum displays no mass, no tenderness and no fullness. Left adnexum displays no mass, no tenderness and no fullness. No erythema, tenderness or bleeding in the vagina. No signs of injury around the vagina. No vaginal discharge found.  Musculoskeletal: Normal range of motion. She exhibits no edema or tenderness.  Lymphadenopathy:    She has no cervical adenopathy.       Right: No inguinal adenopathy present.       Left: No inguinal adenopathy present.  Neurological: She is alert and oriented to person, place, and time. She has normal reflexes. No cranial nerve deficit. Coordination normal.  Skin: Skin is warm and dry. No rash noted. She is not diaphoretic.  Psychiatric: She has a normal mood and affect. Her behavior is normal. Judgment and thought content normal.  Vitals reviewed.    Depression Screen PHQ 2/9 Scores 10/17/2017 06/02/2017  PHQ - 2 Score 0 0      Assessment & Plan:     Routine Health Maintenance and Physical Exam  Exercise Activities and Dietary recommendations Goals    None      Immunization History  Administered Date(s) Administered  . Hepatitis A 03/21/2009, 09/23/2009  . Influenza-Unspecified 01/03/2017  . Tdap 06/02/2006, 10/05/2016     Health Maintenance  Topic Date Due  . HIV Screening  06/02/1991  . PAP SMEAR  06/01/1997  . INFLUENZA VACCINE  11/10/2017  . TETANUS/TDAP  10/06/2026     Discussed health benefits of physical activity, and encouraged her to engage in regular exercise appropriate for her age and condition.    1. Annual physical exam Normal physical exam today. Will check labs as below and f/u pending lab results. If labs are stable and WNL she will not need to have these rechecked for one year at her next annual physical exam. She is to call the office in the meantime if she has any acute issue, questions or concerns. - CBC with Differential/Platelet - Comprehensive metabolic panel - TSH  2. Cervical cancer screening Pap collected today. Will send as below and f/u pending results. - Pap IG and HPV (high risk) DNA detection  3. Pure hypercholesterolemia Will check labs as below and f/u pending results. - Lipid panel  4. Encounter for screening for diabetes mellitus Will check labs as below and f/u pending results. - Hemoglobin A1c  --------------------------------------------------------------------    Mar Daring, PA-C  [facility]

## 2017-10-17 NOTE — Patient Instructions (Signed)

## 2017-10-19 ENCOUNTER — Other Ambulatory Visit: Payer: Self-pay

## 2017-10-19 DIAGNOSIS — Z0000 Encounter for general adult medical examination without abnormal findings: Secondary | ICD-10-CM

## 2017-10-20 ENCOUNTER — Telehealth: Payer: Self-pay

## 2017-10-20 ENCOUNTER — Other Ambulatory Visit: Payer: Self-pay

## 2017-10-20 DIAGNOSIS — Z0000 Encounter for general adult medical examination without abnormal findings: Secondary | ICD-10-CM

## 2017-10-20 LAB — PAP IG AND HPV HIGH-RISK
HPV, high-risk: NEGATIVE
PAP SMEAR COMMENT: 0

## 2017-10-20 NOTE — Telephone Encounter (Signed)
Viewed by Alex Gardener on 10/20/2017 8:50 AM

## 2017-10-20 NOTE — Progress Notes (Signed)
Lab visit only no provider contact made

## 2017-10-20 NOTE — Telephone Encounter (Signed)
-----   Message from Mar Daring, PA-C sent at 10/20/2017  8:42 AM EDT ----- Pap is normal, HPV negative.  Will repeat in 3-5 years.

## 2017-10-21 ENCOUNTER — Telehealth: Payer: Self-pay

## 2017-10-21 LAB — CBC WITH DIFFERENTIAL/PLATELET
BASOS: 0 %
Basophils Absolute: 0 10*3/uL (ref 0.0–0.2)
EOS (ABSOLUTE): 0.1 10*3/uL (ref 0.0–0.4)
EOS: 1 %
HEMATOCRIT: 44.3 % (ref 34.0–46.6)
HEMOGLOBIN: 14.5 g/dL (ref 11.1–15.9)
IMMATURE GRANULOCYTES: 0 %
Immature Grans (Abs): 0 10*3/uL (ref 0.0–0.1)
Lymphocytes Absolute: 1.1 10*3/uL (ref 0.7–3.1)
Lymphs: 16 %
MCH: 30.8 pg (ref 26.6–33.0)
MCHC: 32.7 g/dL (ref 31.5–35.7)
MCV: 94 fL (ref 79–97)
MONOCYTES: 5 %
Monocytes Absolute: 0.4 10*3/uL (ref 0.1–0.9)
NEUTROS PCT: 78 %
Neutrophils Absolute: 5.2 10*3/uL (ref 1.4–7.0)
Platelets: 174 10*3/uL (ref 150–450)
RBC: 4.71 x10E6/uL (ref 3.77–5.28)
RDW: 13.1 % (ref 12.3–15.4)
WBC: 6.7 10*3/uL (ref 3.4–10.8)

## 2017-10-21 LAB — LIPID PANEL
CHOL/HDL RATIO: 2.9 ratio (ref 0.0–4.4)
CHOLESTEROL TOTAL: 230 mg/dL — AB (ref 100–199)
HDL: 80 mg/dL (ref >39)
LDL CALC: 131 mg/dL — AB (ref 0–99)
Triglycerides: 94 mg/dL (ref 0–149)
VLDL CHOLESTEROL CAL: 19 mg/dL (ref 5–40)

## 2017-10-21 LAB — COMPREHENSIVE METABOLIC PANEL
ALK PHOS: 39 IU/L (ref 39–117)
ALT: 17 IU/L (ref 0–32)
AST: 25 IU/L (ref 0–40)
Albumin/Globulin Ratio: 1.6 (ref 1.2–2.2)
Albumin: 4.6 g/dL (ref 3.5–5.5)
BUN/Creatinine Ratio: 11 (ref 9–23)
BUN: 10 mg/dL (ref 6–24)
Bilirubin Total: 0.6 mg/dL (ref 0.0–1.2)
CALCIUM: 9.7 mg/dL (ref 8.7–10.2)
CO2: 24 mmol/L (ref 20–29)
CREATININE: 0.94 mg/dL (ref 0.57–1.00)
Chloride: 101 mmol/L (ref 96–106)
GFR calc Af Amer: 87 mL/min/{1.73_m2} (ref >59)
GFR, EST NON AFRICAN AMERICAN: 76 mL/min/{1.73_m2} (ref >59)
GLUCOSE: 90 mg/dL (ref 65–99)
Globulin, Total: 2.9 g/dL (ref 1.5–4.5)
Potassium: 4.6 mmol/L (ref 3.5–5.2)
Sodium: 139 mmol/L (ref 134–144)
Total Protein: 7.5 g/dL (ref 6.0–8.5)

## 2017-10-21 LAB — TSH: TSH: 4.11 u[IU]/mL (ref 0.450–4.500)

## 2017-10-21 LAB — HGB A1C W/O EAG: Hgb A1c MFr Bld: 5.2 % (ref 4.8–5.6)

## 2017-10-21 NOTE — Telephone Encounter (Signed)
Viewed by Alex Gardener on 10/21/2017 1:22 PM

## 2017-10-21 NOTE — Telephone Encounter (Signed)
-----   Message from Mar Daring, PA-C sent at 10/21/2017 12:51 PM EDT ----- Cholesterol up just slightly from last year. Thyroid normal. Sugar normal Blood count normal. Kidney and liver function normal.

## 2017-11-26 ENCOUNTER — Encounter: Payer: Self-pay | Admitting: Family Medicine

## 2017-11-26 ENCOUNTER — Ambulatory Visit (INDEPENDENT_AMBULATORY_CARE_PROVIDER_SITE_OTHER): Payer: BLUE CROSS/BLUE SHIELD | Admitting: Family Medicine

## 2017-11-26 VITALS — BP 108/66 | HR 80 | Temp 98.5°F | Resp 16 | Wt 139.0 lb

## 2017-11-26 DIAGNOSIS — J029 Acute pharyngitis, unspecified: Secondary | ICD-10-CM | POA: Diagnosis not present

## 2017-11-26 LAB — POCT RAPID STREP A (OFFICE): RAPID STREP A SCREEN: NEGATIVE

## 2017-11-26 NOTE — Progress Notes (Signed)
Patient: Rebecca Powers Female    DOB: 1975/05/19   42 y.o.   MRN: [id number] Visit Date: 11/26/2017  Today's Provider: Wilhemena Durie, MD   Chief Complaint  Patient presents with  . Sore Throat   Subjective:    Sore Throat   This is a new problem. Episode onset: x 3 days. The problem has been gradually worsening. Neither side of throat is experiencing more pain than the other. Maximum temperature: highest documented temp has been over 99 degrees. The pain is severe. Associated symptoms include congestion, coughing, a hoarse voice, a plugged ear sensation, shortness of breath, swollen glands and trouble swallowing. Pertinent negatives include no abdominal pain, diarrhea, ear discharge, ear pain, headaches, neck pain or vomiting. She has tried cool liquids (NyQuil) for the symptoms. The treatment provided mild relief.  No exposure to anyone sick.     No Known Allergies   Current Outpatient Medications:  .  Brimonidine Tartrate (MIRVASO) 0.33 % GEL, Apply topically daily., Disp: , Rfl:  .  lamoTRIgine (LAMICTAL) 100 MG tablet, , Disp: , Rfl: 2 .  norgestimate-ethinyl estradiol (PREVIFEM) 0.25-35 MG-MCG tablet, TAKE 1 BY MOUTH DAILY, Disp: 84 tablet, Rfl: 3  Review of Systems  Constitutional: Negative.   HENT: Positive for congestion, hoarse voice and trouble swallowing. Negative for ear discharge and ear pain.   Respiratory: Positive for cough and shortness of breath.   Gastrointestinal: Negative for abdominal pain, diarrhea and vomiting.  Musculoskeletal: Negative for neck pain.  Allergic/Immunologic: Negative.   Neurological: Negative for headaches.  Psychiatric/Behavioral: Negative.     Social History   Tobacco Use  . Smoking status: Never Smoker  . Smokeless tobacco: Never Used  Substance Use Topics  . Alcohol use: No    Alcohol/week: 0.0 standard drinks   Objective:   BP 108/66 (BP Location: Left Arm, Patient Position: Sitting, Cuff Size: Normal)   Pulse  80   Temp 98.5 F (36.9 C) (Oral)   Resp 16   Wt 139 lb (63 kg)   LMP 11/20/2017   SpO2 100%   BMI 22.44 kg/m  Vitals:   11/26/17 0943  BP: 108/66  Pulse: 80  Resp: 16  Temp: 98.5 F (36.9 C)  TempSrc: Oral  SpO2: 100%  Weight: 139 lb (63 kg)     Physical Exam  Constitutional: She appears well-developed and well-nourished.  HENT:  Head: Normocephalic and atraumatic.  Right Ear: Tympanic membrane normal.  Left Ear: Tympanic membrane and ear canal normal.  Mouth/Throat: Uvula is midline, oropharynx is clear and moist and mucous membranes are normal. No oropharyngeal exudate.  Eyes: Pupils are equal, round, and reactive to light.  Neck: No thyromegaly present.  Cardiovascular: Normal rate, regular rhythm and normal heart sounds.  Pulmonary/Chest: Effort normal.  Abdominal: Soft.  Lymphadenopathy:    She has no cervical adenopathy.  Neurological: She is alert.  Skin: Skin is warm.  Psychiatric: She has a normal mood and affect.        Assessment & Plan:     1. Sore throat Likely viral syndrome. Supportive care . Further evaluation if she worsens or does not improve. Pt ion agreement. - POCT rapid strep A     Patient seen and examined by Miguel Aschoff, MD, and note scribed by Renaldo Fiddler, CMA.  I have done the exam and reviewed the above chart and it is accurate to the best of my knowledge. Development worker, community has been used in this  note in any air is in the dictation or transcription are unintentional.  Wilhemena Durie, MD  [facility]

## 2018-01-10 DIAGNOSIS — D1800 Hemangioma unspecified site: Secondary | ICD-10-CM | POA: Diagnosis not present

## 2018-01-10 DIAGNOSIS — L812 Freckles: Secondary | ICD-10-CM | POA: Diagnosis not present

## 2018-01-10 DIAGNOSIS — Z1283 Encounter for screening for malignant neoplasm of skin: Secondary | ICD-10-CM | POA: Diagnosis not present

## 2018-01-10 DIAGNOSIS — D2270 Melanocytic nevi of unspecified lower limb, including hip: Secondary | ICD-10-CM | POA: Diagnosis not present

## 2018-01-19 ENCOUNTER — Telehealth: Payer: Self-pay

## 2018-01-19 NOTE — Telephone Encounter (Signed)
Contacted patient to inform her that she  will need to be seen at one of the travel clinics before going out of the country.  Patient is on Lamictal which may have possible interactions. She will need to be seen 4 weeks prior to her travel. Patient verbalizes understanding and will follow up with the travel clinic in [place] as soon as possible.

## 2018-02-06 DIAGNOSIS — Z23 Encounter for immunization: Secondary | ICD-10-CM | POA: Diagnosis not present

## 2018-02-10 ENCOUNTER — Telehealth: Payer: Self-pay | Admitting: Adult Health

## 2018-02-10 NOTE — Telephone Encounter (Signed)
02/10/2018 Patient emailed  Secretary regarding the travel clinic would not prescribe Diamox for altitude sickness she reports the travel clinic no longer prescribes, she also reports that her primary care clinic referred her to travel clinic for this and would not prescribe as well.  She has been seen in travel clinic and given medications for travel per her report.   Patient is also still on Lamictal.   Advised patient this clinic does not prescribe Diamox and would have to refer to travel clinic for further questions. FDA issued discontinuation of Diamox brand per article due to reasons other than safety.( generic appears to be still available) Patient was advised. She will contact travel clinic and her PCP if any further questions.   Patient verbalized understanding of all instructions given and denies any further questions at this time.

## 2018-02-14 ENCOUNTER — Telehealth: Payer: Self-pay | Admitting: Physician Assistant

## 2018-02-14 DIAGNOSIS — Z298 Encounter for other specified prophylactic measures: Secondary | ICD-10-CM

## 2018-02-14 NOTE — Telephone Encounter (Signed)
Pt is traveling to [place] in January.  She has had all the shots at [facility] clinic. Pt still needing Diamox - altitude sickness medication.  She's asking if Tawanna Sat could call this in for her since [place] doesn't offer this?  Please let pt know asap.   Please fill at: [facility] [place] [place] (Phone) [phone] (Fax)   Thanks, [facility]

## 2018-02-15 MED ORDER — ACETAZOLAMIDE ER 500 MG PO CP12: 500.0000 mg | ORAL_CAPSULE | Freq: Two times a day (BID) | ORAL | 0 refills | Status: DC

## 2018-02-15 NOTE — Telephone Encounter (Signed)
Yes I can send it in. Can she remind me how long she will be gone for?  Thanks.

## 2018-02-15 NOTE — Telephone Encounter (Signed)
LMTCB

## 2018-02-15 NOTE — Telephone Encounter (Signed)
Pt returned a missed call.  Wanted to let Rebecca Powers know she will be gone for 10 days - climb will be for 6 days.  Thanks, [employer]

## 2018-02-15 NOTE — Telephone Encounter (Signed)
[facility] to [facility] [place]. Start 2 days prior to climb

## 2018-02-17 NOTE — Telephone Encounter (Signed)
LM that prescription was sent. 

## 2018-02-23 ENCOUNTER — Other Ambulatory Visit: Payer: Self-pay | Admitting: Physician Assistant

## 2018-02-23 DIAGNOSIS — Z3041 Encounter for surveillance of contraceptive pills: Secondary | ICD-10-CM

## 2018-02-23 MED ORDER — NORGESTIMATE-ETH ESTRADIOL 0.25-35 MG-MCG PO TABS: ORAL_TABLET | ORAL | 3 refills | Status: DC

## 2018-02-23 NOTE — Telephone Encounter (Signed)
Refilled

## 2018-02-23 NOTE — Telephone Encounter (Signed)
[facility] faxed refill request for the following medications:  norgestimate-ethinyl estradiol (PREVIFEM) 0.25-35 MG-MCG tablet  90 day Supply  Last Rx: 05/31/17  LOV: 10/17/17 Please advise. Thanks TNP

## 2018-02-23 NOTE — Telephone Encounter (Signed)
Patient was advised via voicemail per DRP. 

## 2018-03-20 DIAGNOSIS — F3181 Bipolar II disorder: Secondary | ICD-10-CM | POA: Diagnosis not present

## 2018-05-31 ENCOUNTER — Encounter: Payer: Self-pay | Admitting: Medical

## 2018-05-31 ENCOUNTER — Ambulatory Visit: Payer: Self-pay | Admitting: Medical

## 2018-05-31 VITALS — BP 135/80 | HR 96 | Temp 100.1°F | Resp 16 | Ht 66.0 in | Wt 140.0 lb

## 2018-05-31 DIAGNOSIS — R6889 Other general symptoms and signs: Secondary | ICD-10-CM

## 2018-05-31 MED ORDER — OSELTAMIVIR PHOSPHATE 75 MG PO CAPS: 75.0000 mg | ORAL_CAPSULE | Freq: Two times a day (BID) | ORAL | 0 refills | Status: DC

## 2018-05-31 NOTE — Progress Notes (Signed)
Subjective:    Patient ID: Rebecca Powers, female    DOB: Feb 23, 1976, 43 y.o.   MRN: [id number]  HPI 43 yo female in non acute distress.Started with symptoms in the morning yesterday with  , fatigued and body aches.. At  About  1:30pm felt worse and went home. At home temp 101.0. Cough  Started Apr 28, 2018 productive yellow in color at times..  In [place] for J-term ( 11 days).  Denies being in contact with  any one diagnoised with COVID-2019 or any one from [place]..  Was ill in [place] with traveler diarrhea treated self with Cipro , symptoms resolved.   History of Bronchitis as a child.   Nonsmoker, and does not vape. Blood pressure 135/80, pulse 96, temperature 100.1 F (37.8 C), resp. rate 16, height 5\' 6"  (1.676 m), weight 140 lb (63.5 kg), last menstrual period 05/10/2018, SpO2 99 %. No Known Allergies    Review of Systems  Constitutional: Positive for appetite change (decreased), chills, fatigue and fever.  HENT: Positive for congestion, postnasal drip (her norm , nothing new), sore throat (a little bit) and voice change (raspy). Negative for ear discharge, ear pain, rhinorrhea, sinus pressure, sinus pain and sneezing.   Eyes: Negative for discharge and itching.  Respiratory: Positive for cough. Negative for chest tightness, shortness of breath and wheezing.   Cardiovascular: Positive for chest pain (with coughing).  Gastrointestinal: Negative for abdominal pain, diarrhea, nausea and vomiting.  Genitourinary: Negative for dysuria.  Musculoskeletal: Positive for myalgias.  Skin: Negative for rash.  Allergic/Immunologic: Negative for environmental allergies and food allergies.  Neurological: Negative for dizziness, syncope and headaches.  Hematological: Negative for adenopathy.  Psychiatric/Behavioral: Negative for behavioral problems, confusion, self-injury and suicidal ideas.       Objective:   Physical Exam Constitutional:      Appearance: Normal appearance. She is  normal weight.  HENT:     Head: Normocephalic and atraumatic.     Jaw: There is normal jaw occlusion.     Nose: Congestion present.     Mouth/Throat:     Mouth: Mucous membranes are moist.     Pharynx: Oropharynx is clear. No oropharyngeal exudate or posterior oropharyngeal erythema.  Eyes:     Extraocular Movements: Extraocular movements intact.     Conjunctiva/sclera: Conjunctivae normal.     Pupils: Pupils are equal, round, and reactive to light.  Neck:     Musculoskeletal: Normal range of motion and neck supple.  Cardiovascular:     Rate and Rhythm: Normal rate and regular rhythm.     Heart sounds: Normal heart sounds.  Pulmonary:     Effort: Pulmonary effort is normal.     Breath sounds: Normal breath sounds.  Musculoskeletal: Normal range of motion.  Lymphadenopathy:     Cervical: Cervical adenopathy present.  Skin:    General: Skin is warm.  Neurological:     General: No focal deficit present.     Mental Status: She is alert and oriented to person, place, and time.  Psychiatric:        Mood and Affect: Mood normal.        Behavior: Behavior normal.        Thought Content: Thought content normal.        Judgment: Judgment normal.    Flu test negative       Assessment & Plan:  Flu-like symptoms Cough Rest m increase fluids. OTC Motrin or Tylenol per package directions for fever or pain. Meds  ordered this encounter  Medications  . oseltamivir (TAMIFLU) 75 MG capsule    Sig: Take 1 capsule (75 mg total) by mouth 2 (two) times daily.    Dispense:  10 capsule    Refill:  0  Return if shortness of breath, chest pain, wheezing., elevated fever  Or any  other concerns for reevaluation. May utilize an OTC cough medication like Delsym. Reviewed with patient she must be fever free x 24 hours with no antipyretics before returning ot work. Patient verbalizes understanding and has no questions at discharge.

## 2018-06-01 LAB — POCT INFLUENZA A/B
Influenza A, POC: NEGATIVE
Influenza B, POC: NEGATIVE

## 2018-09-18 DIAGNOSIS — F3181 Bipolar II disorder: Secondary | ICD-10-CM | POA: Diagnosis not present

## 2018-09-28 ENCOUNTER — Other Ambulatory Visit: Payer: Self-pay | Admitting: Physician Assistant

## 2018-09-28 ENCOUNTER — Other Ambulatory Visit: Payer: Self-pay | Admitting: Family Medicine

## 2018-09-28 DIAGNOSIS — Z1231 Encounter for screening mammogram for malignant neoplasm of breast: Secondary | ICD-10-CM

## 2018-11-06 ENCOUNTER — Other Ambulatory Visit: Payer: Self-pay

## 2018-11-06 ENCOUNTER — Ambulatory Visit
Admission: RE | Admit: 2018-11-06 | Discharge: 2018-11-06 | Disposition: A | Discharge status: Home / Self Care | Payer: BC Managed Care – PPO | Source: Ambulatory Visit | Attending: Family Medicine | Admitting: Family Medicine

## 2018-11-06 DIAGNOSIS — Z1231 Encounter for screening mammogram for malignant neoplasm of breast: Secondary | ICD-10-CM | POA: Insufficient documentation

## 2018-11-07 ENCOUNTER — Telehealth: Payer: Self-pay

## 2018-11-07 NOTE — Telephone Encounter (Signed)
-----   Message from Mar Daring, PA-C sent at 11/07/2018  9:55 AM EDT ----- Mammogram normal. Repeat in one year.

## 2018-11-07 NOTE — Telephone Encounter (Signed)
Patient notified of mammogram results.

## 2018-12-27 ENCOUNTER — Other Ambulatory Visit: Payer: Self-pay

## 2018-12-27 ENCOUNTER — Ambulatory Visit: Payer: Self-pay

## 2018-12-27 DIAGNOSIS — Z23 Encounter for immunization: Secondary | ICD-10-CM

## 2019-01-12 DIAGNOSIS — Z1283 Encounter for screening for malignant neoplasm of skin: Secondary | ICD-10-CM | POA: Diagnosis not present

## 2019-01-12 DIAGNOSIS — D2272 Melanocytic nevi of left lower limb, including hip: Secondary | ICD-10-CM | POA: Diagnosis not present

## 2019-01-12 DIAGNOSIS — D1800 Hemangioma unspecified site: Secondary | ICD-10-CM | POA: Diagnosis not present

## 2019-01-12 DIAGNOSIS — D2271 Melanocytic nevi of right lower limb, including hip: Secondary | ICD-10-CM | POA: Diagnosis not present

## 2019-03-06 ENCOUNTER — Other Ambulatory Visit: Payer: Self-pay | Admitting: Physician Assistant

## 2019-03-06 DIAGNOSIS — Z3041 Encounter for surveillance of contraceptive pills: Secondary | ICD-10-CM

## 2019-03-06 MED ORDER — NORGESTIMATE-ETH ESTRADIOL 0.25-35 MG-MCG PO TABS: 1.0000 | ORAL_TABLET | Freq: Every day | ORAL | 1 refills | Status: DC

## 2019-03-06 NOTE — Telephone Encounter (Signed)
Scheduled apt for 03/29/2019 at 2:40.  RX sent to [facility]   Thanks,   -Mickel Baas

## 2019-03-06 NOTE — Telephone Encounter (Signed)
Requested medication (s) are due for refill today:yes  Requested medication (s) are on the active medication list:yes  Last refill:  12/29/2018  Future visit scheduled: no  Notes to clinic:  No valid encounter in last 12 months    Requested Prescriptions  Pending Prescriptions Disp Refills   [place] 0.25-35 MG-MCG tablet [Pharmacy Med Name: MILI TABLETS 28S] 84 tablet 3    Sig: TAKE 1 TABLET BY MOUTH DAILY. GENERIC EQUIVALENT FOR PREVIFEM     OB/GYN:  Contraceptives Failed - 03/06/2019 12:15 PM      Failed - Valid encounter within last 12 months    Recent Outpatient Visits          1 year ago Sore throat   [facility] Jerrol Banana., [place]   1 year ago Annual physical exam   [facility] [place], Clearnce Sorrel, [place]   1 year ago Encounter for surveillance of contraceptive pills   [place], [place], [place]   3 years ago Flu-like symptoms   [place], [place]             Passed - Last BP in normal range    BP Readings from Last 1 Encounters:  05/31/18 135/80

## 2019-03-06 NOTE — Telephone Encounter (Signed)
Left message for patient to call back and schedule an appointment.

## 2019-03-19 DIAGNOSIS — F3181 Bipolar II disorder: Secondary | ICD-10-CM | POA: Diagnosis not present

## 2019-03-28 NOTE — Progress Notes (Signed)
Patient: Rebecca Powers, Female    DOB: 1975/04/20, 43 y.o.   MRN: [id number] Visit Date: 03/29/2019  Today's Provider: Mar Daring, PA-C   Chief Complaint  Patient presents with  . Annual Exam   Subjective:     Annual physical exam Rebecca Powers is a 43 y.o. female who presents today for health maintenance and complete physical. She feels well. She reports exercising. She reports she is sleeping fairly well.  -----------------------------------------------------------------  11/07/18-Mammogram Normal 10/20/2017 Pap is normal, HPV negative. Will repeat in 5 years  Review of Systems  Constitutional: Negative.   HENT: Negative.   Eyes: Negative.   Respiratory: Negative.   Cardiovascular: Negative.   Gastrointestinal: Negative.   Endocrine: Negative.   Genitourinary: Negative.   Musculoskeletal: Negative.   Skin: Negative.   Allergic/Immunologic: Negative.   Neurological: Negative.   Hematological: Negative.   Psychiatric/Behavioral: Positive for sleep disturbance.    Social History      She  reports that she has never smoked. She has never used smokeless tobacco. She reports that she does not drink alcohol or use drugs.       Social History   Socioeconomic History  . Marital status: Single    Spouse name: Not on file  . Number of children: Not on file  . Years of education: Not on file  . Highest education level: Not on file  Occupational History  . Not on file  Tobacco Use  . Smoking status: Never Smoker  . Smokeless tobacco: Never Used  Substance and Sexual Activity  . Alcohol use: No    Alcohol/week: 0.0 standard drinks  . Drug use: No  . Sexual activity: Never    Birth control/protection: Pill    Comment: ovarian cyst and ,excess bleeding mensis  Other Topics Concern  . Not on file  Social History Narrative  . Not on file   Social Determinants of Health   Financial Resource Strain:   . Difficulty of Paying Living Expenses: Not  on file  Food Insecurity:   . Worried About Charity fundraiser in the Last Year: Not on file  . Ran Out of Food in the Last Year: Not on file  Transportation Needs:   . Lack of Transportation (Medical): Not on file  . Lack of Transportation (Non-Medical): Not on file  Physical Activity:   . Days of Exercise per Week: Not on file  . Minutes of Exercise per Session: Not on file  Stress:   . Feeling of Stress : Not on file  Social Connections:   . Frequency of Communication with Friends and Family: Not on file  . Frequency of Social Gatherings with Friends and Family: Not on file  . Attends Religious Services: Not on file  . Active Member of Clubs or Organizations: Not on file  . Attends Archivist Meetings: Not on file  . Marital Status: Not on file    Past Medical History:  Diagnosis Date  . Allergy   . Bipolar disorder ([place])    Followed by Dr. Sheryle Hail  . Depression   . Hyperlipidemia      Patient Active Problem List   Diagnosis Date Noted  . Allergic rhinitis 05/30/2015  . Excess, menstruation 05/30/2015  . Pure hypercholesterolemia 05/30/2015  . Thrombocytopenia ([place]) 05/30/2015  . Avitaminosis D 05/30/2015  . Bipolar affective disorder ([place]) 06/02/2006  . Clinical depression 11/09/2000  . Acne erythematosa 07/22/1999    Past  Surgical History:  Procedure Laterality Date  . none N/A     Family History        Family Status  Relation Name Status  . MGM  Deceased at age 60       Believed to have died from colon Cancer  . MGF  Deceased       CAD  . PGF  Deceased       parkinsons disease  . Mother  Alive  . Father  Alive  . PGM  Deceased       Parkinson Disease, gastro issues,   . Brother  Alive  . Mat Aunt  Alive       unknown med hx  . Mat Nordstrom  . Ethlyn Daniels  Alive       unknown health hx  . Annamarie Major  Alive       unknown health hx        Her family history includes Dementia in her maternal grandfather; Heart disease in her  maternal grandfather and paternal grandfather; Hyperlipidemia in her maternal grandfather, maternal uncle, mother, and paternal grandfather; Migraines in her brother.      No Known Allergies   Current Outpatient Medications:  .  Azelaic Acid (FINACEA EX), Apply topically., Disp: , Rfl:  .  lamoTRIgine (LAMICTAL) 100 MG tablet, , Disp: , Rfl: 2 .  norgestimate-ethinyl estradiol (MILI) 0.25-35 MG-MCG tablet, Take 1 tablet by mouth daily., Disp: 84 tablet, Rfl: 1   Patient Care Team: Mar Daring, PA-C as PCP - General (Family Medicine)    Objective:    Vitals: BP 135/78 (BP Location: Left Arm, Patient Position: Sitting, Cuff Size: Large)   Pulse 77   Temp (!) 97.1 F (36.2 C) (Temporal)   Resp 16   Ht 5\' 6"  (1.676 m)   Wt 147 lb 1.6 oz (66.7 kg)   BMI 23.74 kg/m    Vitals:   03/29/19 1454  BP: 135/78  Pulse: 77  Resp: 16  Temp: (!) 97.1 F (36.2 C)  TempSrc: Temporal  Weight: 147 lb 1.6 oz (66.7 kg)  Height: 5\' 6"  (1.676 m)     Physical Exam Vitals reviewed.  Constitutional:      General: She is not in acute distress.    Appearance: Normal appearance. She is well-developed and normal weight. She is not ill-appearing or diaphoretic.  HENT:     Head: Normocephalic and atraumatic.     Right Ear: Tympanic membrane, ear canal and external ear normal.     Left Ear: Tympanic membrane, ear canal and external ear normal.  Eyes:     General: No scleral icterus.       Right eye: No discharge.        Left eye: No discharge.     Extraocular Movements: Extraocular movements intact.     Conjunctiva/sclera: Conjunctivae normal.     Pupils: Pupils are equal, round, and reactive to light.  Neck:     Thyroid: No thyromegaly.     Vascular: No JVD.     Trachea: No tracheal deviation.  Cardiovascular:     Rate and Rhythm: Normal rate and regular rhythm.     Pulses: Normal pulses.     Heart sounds: Normal heart sounds. No murmur. No friction rub. No gallop.   Pulmonary:       Effort: Pulmonary effort is normal. No respiratory distress.     Breath sounds: Normal breath sounds. No wheezing or rales.  Chest:  Chest wall: No tenderness.  Abdominal:     General: Abdomen is flat. Bowel sounds are normal. There is no distension.     Palpations: Abdomen is soft. There is no mass.     Tenderness: There is no abdominal tenderness. There is no guarding or rebound.  Musculoskeletal:        General: No tenderness. Normal range of motion.     Cervical back: Normal range of motion and neck supple.     Right lower leg: No edema.     Left lower leg: No edema.  Lymphadenopathy:     Cervical: No cervical adenopathy.  Skin:    General: Skin is warm and dry.     Capillary Refill: Capillary refill takes less than 2 seconds.     Findings: No rash.  Neurological:     General: No focal deficit present.     Mental Status: She is alert and oriented to person, place, and time. Mental status is at baseline.  Psychiatric:        Mood and Affect: Mood normal.        Behavior: Behavior normal.        Thought Content: Thought content normal.        Judgment: Judgment normal.      Depression Screen PHQ 2/9 Scores 03/29/2019 10/17/2017 06/02/2017  PHQ - 2 Score 0 0 0       Assessment & Plan:     Routine Health Maintenance and Physical Exam  Exercise Activities and Dietary recommendations Goals   None     Immunization History  Administered Date(s) Administered  . Hepatitis A 03/21/2009, 09/23/2009  . Influenza Split 03/21/2009  . Influenza,inj,Quad PF,6+ Mos 12/27/2018  . Influenza-Unspecified 01/03/2017, 01/10/2018  . Tdap 06/02/2006, 10/05/2016    Health Maintenance  Topic Date Due  . HIV Screening  06/02/1991  . PAP SMEAR-Modifier  10/17/2020  . TETANUS/TDAP  10/06/2026  . INFLUENZA VACCINE  Completed     Discussed health benefits of physical activity, and encouraged her to engage in regular exercise appropriate for her age and condition.    1.  Encounter for annual physical exam Normal physical exam today. Will check labs as below and f/u pending lab results. If labs are stable and WNL she will not need to have these rechecked for one year at her next annual physical exam. She is to call the office in the meantime if she has any acute issue, questions or concerns. - CBC w/Diff - Comprehensive Metabolic Panel (CMET) - HgB A1c - Lipid Profile - TSH  2. Pure hypercholesterolemia Diet controlled. Will check labs as below and f/u pending results. - CBC w/Diff - Comprehensive Metabolic Panel (CMET) - HgB A1c - Lipid Profile  3. Encounter for screening for diabetes mellitus Will check labs as below and f/u pending results. - CBC w/Diff - HgB A1c  4. Thyroid disorder screen Will check labs as below and f/u pending results. - TSH  5. Thrombocytopenia ([place]) H/O this but has been stable. No issues with bruising/bleeding.  6. Bipolar disorder, in full remission, most recent episode depressed ([place]) Stable. Continue Lamictal 100mg .   7. Screening for HIV without presence of risk factors Will check labs as below and f/u pending results. - HIV antibody (with reflex)  --------------------------------------------------------------------    Mar Daring, PA-C  [facility]

## 2019-03-29 ENCOUNTER — Encounter: Payer: Self-pay | Admitting: Physician Assistant

## 2019-03-29 ENCOUNTER — Other Ambulatory Visit: Payer: Self-pay

## 2019-03-29 ENCOUNTER — Ambulatory Visit (INDEPENDENT_AMBULATORY_CARE_PROVIDER_SITE_OTHER): Payer: BC Managed Care – PPO | Admitting: Physician Assistant

## 2019-03-29 VITALS — BP 135/78 | HR 77 | Temp 97.1°F | Resp 16 | Ht 66.0 in | Wt 147.1 lb

## 2019-03-29 DIAGNOSIS — Z0000 Encounter for general adult medical examination without abnormal findings: Secondary | ICD-10-CM | POA: Diagnosis not present

## 2019-03-29 DIAGNOSIS — Z114 Encounter for screening for human immunodeficiency virus [HIV]: Secondary | ICD-10-CM

## 2019-03-29 DIAGNOSIS — E7800 Pure hypercholesterolemia, unspecified: Secondary | ICD-10-CM

## 2019-03-29 DIAGNOSIS — Z131 Encounter for screening for diabetes mellitus: Secondary | ICD-10-CM | POA: Diagnosis not present

## 2019-03-29 DIAGNOSIS — D696 Thrombocytopenia, unspecified: Secondary | ICD-10-CM

## 2019-03-29 DIAGNOSIS — F3176 Bipolar disorder, in full remission, most recent episode depressed: Secondary | ICD-10-CM

## 2019-03-29 DIAGNOSIS — Z1329 Encounter for screening for other suspected endocrine disorder: Secondary | ICD-10-CM

## 2019-03-29 NOTE — Patient Instructions (Signed)
Health Maintenance, Female Adopting a healthy lifestyle and getting preventive care are important in promoting health and wellness. Ask your health care provider about:  The right schedule for you to have regular tests and exams.  Things you can do on your own to prevent diseases and keep yourself healthy. What should I know about diet, weight, and exercise? Eat a healthy diet   Eat a diet that includes plenty of vegetables, fruits, low-fat dairy products, and lean protein.  Do not eat a lot of foods that are high in solid fats, added sugars, or sodium. Maintain a healthy weight Body mass index (BMI) is used to identify weight problems. It estimates body fat based on height and weight. Your health care provider can help determine your BMI and help you achieve or maintain a healthy weight. Get regular exercise Get regular exercise. This is one of the most important things you can do for your health. Most adults should:  Exercise for at least 150 minutes each week. The exercise should increase your heart rate and make you sweat (moderate-intensity exercise).  Do strengthening exercises at least twice a week. This is in addition to the moderate-intensity exercise.  Spend less time sitting. Even light physical activity can be beneficial. Watch cholesterol and blood lipids Have your blood tested for lipids and cholesterol at 42 years of age, then have this test every 5 years. Have your cholesterol levels checked more often if:  Your lipid or cholesterol levels are high.  You are older than 42 years of age.  You are at high risk for heart disease. What should I know about cancer screening? Depending on your health history and family history, you may need to have cancer screening at various ages. This may include screening for:  Breast cancer.  Cervical cancer.  Colorectal cancer.  Skin cancer.  Lung cancer. What should I know about heart disease, diabetes, and high blood  pressure? Blood pressure and heart disease  High blood pressure causes heart disease and increases the risk of stroke. This is more likely to develop in people who have high blood pressure readings, are of African descent, or are overweight.  Have your blood pressure checked: ? Every 3-5 years if you are 18-39 years of age. ? Every year if you are 40 years old or older. Diabetes Have regular diabetes screenings. This checks your fasting blood sugar level. Have the screening done:  Once every three years after age 40 if you are at a normal weight and have a low risk for diabetes.  More often and at a younger age if you are overweight or have a high risk for diabetes. What should I know about preventing infection? Hepatitis B If you have a higher risk for hepatitis B, you should be screened for this virus. Talk with your health care provider to find out if you are at risk for hepatitis B infection. Hepatitis C Testing is recommended for:  Everyone born from 1945 through 1965.  Anyone with known risk factors for hepatitis C. Sexually transmitted infections (STIs)  Get screened for STIs, including gonorrhea and chlamydia, if: ? You are sexually active and are younger than 42 years of age. ? You are older than 42 years of age and your health care provider tells you that you are at risk for this type of infection. ? Your sexual activity has changed since you were last screened, and you are at increased risk for chlamydia or gonorrhea. Ask your health care provider if   you are at risk.  Ask your health care provider about whether you are at high risk for HIV. Your health care provider may recommend a prescription medicine to help prevent HIV infection. If you choose to take medicine to prevent HIV, you should first get tested for HIV. You should then be tested every 3 months for as long as you are taking the medicine. Pregnancy  If you are about to stop having your period (premenopausal) and  you may become pregnant, seek counseling before you get pregnant.  Take 400 to 800 micrograms (mcg) of folic acid every day if you become pregnant.  Ask for birth control (contraception) if you want to prevent pregnancy. Osteoporosis and menopause Osteoporosis is a disease in which the bones lose minerals and strength with aging. This can result in bone fractures. If you are 65 years old or older, or if you are at risk for osteoporosis and fractures, ask your health care provider if you should:  Be screened for bone loss.  Take a calcium or vitamin D supplement to lower your risk of fractures.  Be given hormone replacement therapy (HRT) to treat symptoms of menopause. Follow these instructions at home: Lifestyle  Do not use any products that contain nicotine or tobacco, such as cigarettes, e-cigarettes, and chewing tobacco. If you need help quitting, ask your health care provider.  Do not use street drugs.  Do not share needles.  Ask your health care provider for help if you need support or information about quitting drugs. Alcohol use  Do not drink alcohol if: ? Your health care provider tells you not to drink. ? You are pregnant, may be pregnant, or are planning to become pregnant.  If you drink alcohol: ? Limit how much you use to 0-1 drink a day. ? Limit intake if you are breastfeeding.  Be aware of how much alcohol is in your drink. In the [place] one drink equals one 12 oz bottle of beer (355 mL), one 5 oz glass of wine (148 mL), or one 1 oz glass of hard liquor (44 mL). General instructions  Schedule regular health, dental, and eye exams.  Stay current with your vaccines.  Tell your health care provider if: ? You often feel depressed. ? You have ever been abused or do not feel safe at home. Summary  Adopting a healthy lifestyle and getting preventive care are important in promoting health and wellness.  Follow your health care provider's instructions about healthy  diet, exercising, and getting tested or screened for diseases.  Follow your health care provider's instructions on monitoring your cholesterol and blood pressure. This information is not intended to replace advice given to you by your health care provider. Make sure you discuss any questions you have with your health care provider. Document Released: 10/12/2010 Document Revised: 03/22/2018 Document Reviewed: 03/22/2018 Elsevier Patient Education  2020 Elsevier Inc.  

## 2019-04-17 ENCOUNTER — Other Ambulatory Visit: Payer: Self-pay

## 2019-04-17 DIAGNOSIS — Z0000 Encounter for general adult medical examination without abnormal findings: Secondary | ICD-10-CM

## 2019-04-17 DIAGNOSIS — E7800 Pure hypercholesterolemia, unspecified: Secondary | ICD-10-CM

## 2019-04-17 DIAGNOSIS — Z114 Encounter for screening for human immunodeficiency virus [HIV]: Secondary | ICD-10-CM

## 2019-04-17 DIAGNOSIS — Z1329 Encounter for screening for other suspected endocrine disorder: Secondary | ICD-10-CM

## 2019-04-17 DIAGNOSIS — Z131 Encounter for screening for diabetes mellitus: Secondary | ICD-10-CM

## 2019-04-18 LAB — COMPREHENSIVE METABOLIC PANEL
ALT: 15 IU/L (ref 0–32)
AST: 23 IU/L (ref 0–40)
Albumin/Globulin Ratio: 1.8 (ref 1.2–2.2)
Albumin: 4.6 g/dL (ref 3.8–4.8)
Alkaline Phosphatase: 51 IU/L (ref 39–117)
BUN/Creatinine Ratio: 11 (ref 9–23)
BUN: 11 mg/dL (ref 6–24)
Bilirubin Total: 0.5 mg/dL (ref 0.0–1.2)
CO2: 25 mmol/L (ref 20–29)
Calcium: 9.5 mg/dL (ref 8.7–10.2)
Chloride: 102 mmol/L (ref 96–106)
Creatinine, Ser: 1.03 mg/dL — ABNORMAL HIGH (ref 0.57–1.00)
GFR calc Af Amer: 77 mL/min/{1.73_m2} (ref >59)
GFR calc non Af Amer: 67 mL/min/{1.73_m2} (ref >59)
Globulin, Total: 2.5 g/dL (ref 1.5–4.5)
Glucose: 92 mg/dL (ref 65–99)
Potassium: 4.5 mmol/L (ref 3.5–5.2)
Sodium: 140 mmol/L (ref 134–144)
Total Protein: 7.1 g/dL (ref 6.0–8.5)

## 2019-04-18 LAB — LIPID PANEL
Chol/HDL Ratio: 3.2 ratio (ref 0.0–4.4)
Cholesterol, Total: 256 mg/dL — ABNORMAL HIGH (ref 100–199)
HDL: 81 mg/dL (ref >39)
LDL Chol Calc (NIH): 155 mg/dL — ABNORMAL HIGH (ref 0–99)
Triglycerides: 114 mg/dL (ref 0–149)
VLDL Cholesterol Cal: 20 mg/dL (ref 5–40)

## 2019-04-18 LAB — CBC WITH DIFFERENTIAL/PLATELET
Basophils Absolute: 0 10*3/uL (ref 0.0–0.2)
Basos: 1 %
EOS (ABSOLUTE): 0.1 10*3/uL (ref 0.0–0.4)
Eos: 2 %
Hematocrit: 42.4 % (ref 34.0–46.6)
Hemoglobin: 14.1 g/dL (ref 11.1–15.9)
Immature Grans (Abs): 0 10*3/uL (ref 0.0–0.1)
Immature Granulocytes: 0 %
Lymphocytes Absolute: 1.4 10*3/uL (ref 0.7–3.1)
Lymphs: 30 %
MCH: 30.6 pg (ref 26.6–33.0)
MCHC: 33.3 g/dL (ref 31.5–35.7)
MCV: 92 fL (ref 79–97)
Monocytes Absolute: 0.3 10*3/uL (ref 0.1–0.9)
Monocytes: 6 %
Neutrophils Absolute: 3 10*3/uL (ref 1.4–7.0)
Neutrophils: 61 %
Platelets: 168 10*3/uL (ref 150–450)
RBC: 4.61 x10E6/uL (ref 3.77–5.28)
RDW: 11.7 % (ref 11.7–15.4)
WBC: 4.9 10*3/uL (ref 3.4–10.8)

## 2019-04-18 LAB — HGB A1C W/O EAG: Hgb A1c MFr Bld: 5.1 % (ref 4.8–5.6)

## 2019-04-18 LAB — HIV ANTIBODY (ROUTINE TESTING W REFLEX): HIV Screen 4th Generation wRfx: NONREACTIVE

## 2019-04-18 LAB — TSH: TSH: 4.17 u[IU]/mL (ref 0.450–4.500)

## 2019-04-19 ENCOUNTER — Telehealth: Payer: Self-pay

## 2019-04-19 NOTE — Telephone Encounter (Signed)
[employer]   Result Notes and Comments to Patient Comment seen by patient Rebecca Powers on 04/18/2019 7:58 PM EST

## 2019-04-19 NOTE — Telephone Encounter (Signed)
-----   Message from Mar Daring, [place] sent at 04/18/2019  2:16 PM EST ----- Blood count is normal. Kidney function was down just very slightly. This is most often associated with dehydration for fasting for labs. Liver enzymes are normal. Sodium, potassium and calcium are normal. Thyroid is normal. A1c/sugar is normal. Cholesterol has also increased again just slightly compared to last year. However, your HDL (good) cholesterol is high at 81 and offers cardioprotection. Currently your risk of having a cardiovascular event over the next 10 years is very low at a 0.6% chance. No need for cholesterol lowering medication. The HIV screen done once in a lifetime, unless exposed, is negative.

## 2019-06-22 ENCOUNTER — Other Ambulatory Visit: Payer: Self-pay | Admitting: Physician Assistant

## 2019-06-22 DIAGNOSIS — Z3041 Encounter for surveillance of contraceptive pills: Secondary | ICD-10-CM

## 2019-06-22 NOTE — Telephone Encounter (Signed)
Requested medication (s) are due for refill today:   Yes  Requested medication (s) are on the active medication list:   Yes  Future visit scheduled:   Yes  03/31/2020 with [place]   Last ordered: 03/06/2019  #84  1 refill  Clinic note:   Wasn't sure whether she needed an appt or not for a refill.    She saw Anderson Malta on 03/29/2019 for her annual exam.  She is scheduled for her next annual exam on 03/31/2020.   There is a note to schedule an OV before any future refills.   Didn't know if this was an old note or if it was current.   Returned for provider review.  Requested Prescriptions  Pending Prescriptions Disp Refills   MILI 0.25-35 MG-MCG tablet [Pharmacy Med Name: MILI TABLETS 28S] 84 tablet 1    Sig: TAKE 1 TABLET BY MOUTH DAILY. PLEASE SCHEDULE OFFICE VISIT BEFORE ANY FUTURE REFILLS      OB/GYN:  Contraceptives Failed - 06/22/2019 12:25 PM      Failed - Valid encounter within last 12 months    Recent Outpatient Visits           2 months ago Encounter for annual physical exam   [facility] Mar Daring, [place]   1 year ago Sore throat   [facility] Jerrol Banana., [place]   1 year ago Annual physical exam   [facility] [place], [place], [place]   2 years ago Encounter for surveillance of contraceptive pills   [place], [place], [place]   4 years ago Flu-like symptoms   [place], [place]              Passed - Last BP in normal range    BP Readings from Last 1 Encounters:  03/29/19 135/78

## 2019-09-24 DIAGNOSIS — F3181 Bipolar II disorder: Secondary | ICD-10-CM | POA: Diagnosis not present

## 2019-10-24 ENCOUNTER — Other Ambulatory Visit: Payer: Self-pay | Admitting: Physician Assistant

## 2019-10-24 DIAGNOSIS — Z1231 Encounter for screening mammogram for malignant neoplasm of breast: Secondary | ICD-10-CM

## 2019-10-29 ENCOUNTER — Other Ambulatory Visit: Payer: Self-pay | Admitting: Physician Assistant

## 2019-10-29 DIAGNOSIS — Z3041 Encounter for surveillance of contraceptive pills: Secondary | ICD-10-CM

## 2019-10-29 NOTE — Telephone Encounter (Signed)
Requested medication (s) are due for refill today: no  Requested medication (s) are on the active medication list: no  Last refill:  08/24/2019  Future visit scheduled: yes  Notes to clinic:  verify medication  for refill   Requested Prescriptions  Pending Prescriptions Disp Refills   ESTARYLLA 0.25-35 MG-MCG tablet [Pharmacy Med Name: [facility] ES(ESTARYLLA)TB 28S 0.25/35] 84 tablet 3    Sig: TAKE 1 TABLET BY MOUTH DAILY . PLEASE SCHEDULE OFFICE VISIT BEFORE ANY FUTURE REFILLS      OB/GYN:  Contraceptives Passed - 10/29/2019 12:12 AM      Passed - Last BP in normal range    BP Readings from Last 1 Encounters:  03/29/19 135/78          Passed - Valid encounter within last 12 months    Recent Outpatient Visits           7 months ago Encounter for annual physical exam   [facility] Mar Daring, [place]   1 year ago Sore throat   [facility] Jerrol Banana., [place]   2 years ago Annual physical exam   [facility] [place], Clearnce Sorrel, [place]   2 years ago Encounter for surveillance of contraceptive pills   [place], Clearnce Sorrel, [place]   4 years ago Flu-like symptoms   [place], Clearnce Sorrel, [place]       Future Appointments             In 5 months [place], Clearnce Sorrel, PA-C [employer], [place]

## 2019-10-30 NOTE — Telephone Encounter (Signed)
[facility]bal [facility]. Spoke with Tax inspector.

## 2019-11-08 ENCOUNTER — Ambulatory Visit
Admission: RE | Admit: 2019-11-08 | Discharge: 2019-11-08 | Disposition: A | Discharge status: Home / Self Care | Payer: BC Managed Care – PPO | Source: Ambulatory Visit | Attending: Physician Assistant | Admitting: Physician Assistant

## 2019-11-08 DIAGNOSIS — Z1231 Encounter for screening mammogram for malignant neoplasm of breast: Secondary | ICD-10-CM | POA: Diagnosis not present

## 2019-11-12 ENCOUNTER — Telehealth: Payer: Self-pay

## 2019-11-12 NOTE — Telephone Encounter (Signed)
-----   Message from Mar Daring, [place] sent at 11/12/2019  5:05 PM EDT ----- Normal mammogram. Repeat screening in one year.

## 2019-11-12 NOTE — Telephone Encounter (Signed)
LMTCB-if patient calls back ok for [facility] nurse to give results °

## 2019-11-13 NOTE — Telephone Encounter (Signed)
Phone call to pt.  Left vm to return call to office for xray results.

## 2019-11-13 NOTE — Telephone Encounter (Signed)
Seen by patient Rebecca Powers on 11/12/2019 8:57 PM

## 2020-02-04 ENCOUNTER — Other Ambulatory Visit: Payer: Self-pay

## 2020-02-04 ENCOUNTER — Ambulatory Visit (INDEPENDENT_AMBULATORY_CARE_PROVIDER_SITE_OTHER): Payer: BC Managed Care – PPO | Admitting: Dermatology

## 2020-02-04 DIAGNOSIS — D229 Melanocytic nevi, unspecified: Secondary | ICD-10-CM

## 2020-02-04 DIAGNOSIS — L719 Rosacea, unspecified: Secondary | ICD-10-CM | POA: Diagnosis not present

## 2020-02-04 DIAGNOSIS — L813 Cafe au lait spots: Secondary | ICD-10-CM

## 2020-02-04 DIAGNOSIS — Z1283 Encounter for screening for malignant neoplasm of skin: Secondary | ICD-10-CM | POA: Diagnosis not present

## 2020-02-04 DIAGNOSIS — D1800 Hemangioma unspecified site: Secondary | ICD-10-CM

## 2020-02-04 DIAGNOSIS — I781 Nevus, non-neoplastic: Secondary | ICD-10-CM

## 2020-02-04 DIAGNOSIS — L578 Other skin changes due to chronic exposure to nonionizing radiation: Secondary | ICD-10-CM

## 2020-02-04 DIAGNOSIS — D2272 Melanocytic nevi of left lower limb, including hip: Secondary | ICD-10-CM

## 2020-02-04 DIAGNOSIS — L814 Other melanin hyperpigmentation: Secondary | ICD-10-CM

## 2020-02-04 DIAGNOSIS — D225 Melanocytic nevi of trunk: Secondary | ICD-10-CM

## 2020-02-04 NOTE — Progress Notes (Signed)
   Follow-Up Visit   Subjective  Rebecca Powers is a 44 y.o. female who presents for the following: Annual Exam and Rosacea (Controlled with Finacea Foam QD.).  Doesn't get bumps, but stays a little red, and uses green make-up to cover it.  No new or changing lesions.  The following portions of the chart were reviewed this encounter and updated as appropriate:      Review of Systems:  No other skin or systemic complaints except as noted in HPI or Assessment and Plan.  Objective  Well appearing patient in no apparent distress; mood and affect are within normal limits.  A full examination was performed including scalp, head, eyes, ears, nose, lips, neck, chest, axillae, abdomen, back, buttocks, bilateral upper extremities, bilateral lower extremities, hands, feet, fingers, toes, fingernails, and toenails. All findings within normal limits unless otherwise noted below.  Objective  Brown macules.  Left Lateral Calf: 2.5 mm light brown macule  Right Buttock: 4.0 mm medium light brown papule  Objective  Spinal Lower Back: 8.0 x 4.5 cm light brown patch  Objective  Face: Mild erythema on cheeks and nose.  Make-up on   Assessment & Plan   Skin cancer screening performed today.  Melanocytic Nevi - Tan-brown and/or pink-flesh-colored symmetric macules and papules - Benign appearing on exam today - Observation - Call clinic for new or changing moles - Recommend daily use of broad spectrum spf 30+ sunscreen to sun-exposed areas.   Lentigines - Scattered tan macules - Discussed due to sun exposure - Benign, observe, Discussed BBL - Call for any changes  Hemangiomas - Red papules - Discussed benign nature - Observe - Call for any changes  Spider Veins - Dilated blue, purple or red veins at the lower extremities - Reassured - These can be treated by sclerotherapy (a procedure to inject a medicine into the veins to make them disappear) if desired, but the treatment is not  covered by insurance  Actinic Damage - diffuse scaly erythematous macules with underlying dyspigmentation - Recommend daily broad spectrum sunscreen SPF 30+ to sun-exposed areas, reapply every 2 hours as needed.  - Call for new or changing lesions.   Nevus (2) Right Buttock; Left Lateral Calf  Benign-appearing.  Observation.  Call clinic for new or changing moles.  Recommend daily use of broad spectrum spf 30+ sunscreen to sun-exposed areas.    Cafe au lait spots Spinal Lower Back  Benign-appearing.  Observation.  Call clinic for new or changing moles.  Recommend daily use of broad spectrum spf 30+ sunscreen to sun-exposed areas.    Rosacea Face  Controlled with persistent mid face redness Continue Finacea Foam  Apply to face QD. Patient will call for refills. Recommend daily broad spectrum sunscreen SPF 30+ to face  Discussed laser treatments (BBL) to help with redness, $350/tx. Patient will need more than one treatment to get desired results.  Return in about 1 year (around 02/03/2021) for TBSE, rosacea.  IJamesetta Orleans, CMA, am acting as scribe for Brendolyn Patty, MD .  Documentation: I have reviewed the above documentation for accuracy and completeness, and I agree with the above.  Brendolyn Patty MD

## 2020-02-04 NOTE — Patient Instructions (Signed)

## 2020-03-24 DIAGNOSIS — F3181 Bipolar II disorder: Secondary | ICD-10-CM | POA: Diagnosis not present

## 2020-03-31 ENCOUNTER — Encounter: Payer: Self-pay | Admitting: Physician Assistant

## 2020-04-16 ENCOUNTER — Encounter: Payer: Self-pay | Admitting: Physician Assistant

## 2020-04-30 ENCOUNTER — Ambulatory Visit (INDEPENDENT_AMBULATORY_CARE_PROVIDER_SITE_OTHER): Payer: BC Managed Care – PPO | Admitting: Physician Assistant

## 2020-04-30 ENCOUNTER — Other Ambulatory Visit: Payer: Self-pay

## 2020-04-30 ENCOUNTER — Encounter: Payer: Self-pay | Admitting: Physician Assistant

## 2020-04-30 VITALS — BP 125/79 | HR 81 | Temp 98.6°F | Wt 143.0 lb

## 2020-04-30 DIAGNOSIS — E7800 Pure hypercholesterolemia, unspecified: Secondary | ICD-10-CM | POA: Diagnosis not present

## 2020-04-30 DIAGNOSIS — E559 Vitamin D deficiency, unspecified: Secondary | ICD-10-CM

## 2020-04-30 DIAGNOSIS — Z1159 Encounter for screening for other viral diseases: Secondary | ICD-10-CM

## 2020-04-30 DIAGNOSIS — D696 Thrombocytopenia, unspecified: Secondary | ICD-10-CM

## 2020-04-30 DIAGNOSIS — F3176 Bipolar disorder, in full remission, most recent episode depressed: Secondary | ICD-10-CM | POA: Diagnosis not present

## 2020-04-30 DIAGNOSIS — Z0000 Encounter for general adult medical examination without abnormal findings: Secondary | ICD-10-CM | POA: Diagnosis not present

## 2020-04-30 DIAGNOSIS — T148XXA Other injury of unspecified body region, initial encounter: Secondary | ICD-10-CM

## 2020-04-30 NOTE — Patient Instructions (Signed)
Health Maintenance, Female Adopting a healthy lifestyle and getting preventive care are important in promoting health and wellness. Ask your health care provider about:  The right schedule for you to have regular tests and exams.  Things you can do on your own to prevent diseases and keep yourself healthy. What should I know about diet, weight, and exercise? Eat a healthy diet  Eat a diet that includes plenty of vegetables, fruits, low-fat dairy products, and lean protein.  Do not eat a lot of foods that are high in solid fats, added sugars, or sodium.   Maintain a healthy weight Body mass index (BMI) is used to identify weight problems. It estimates body fat based on height and weight. Your health care provider can help determine your BMI and help you achieve or maintain a healthy weight. Get regular exercise Get regular exercise. This is one of the most important things you can do for your health. Most adults should:  Exercise for at least 150 minutes each week. The exercise should increase your heart rate and make you sweat (moderate-intensity exercise).  Do strengthening exercises at least twice a week. This is in addition to the moderate-intensity exercise.  Spend less time sitting. Even light physical activity can be beneficial. Watch cholesterol and blood lipids Have your blood tested for lipids and cholesterol at 44 years of age, then have this test every 5 years. Have your cholesterol levels checked more often if:  Your lipid or cholesterol levels are high.  You are older than 44 years of age.  You are at high risk for heart disease. What should I know about cancer screening? Depending on your health history and family history, you may need to have cancer screening at various ages. This may include screening for:  Breast cancer.  Cervical cancer.  Colorectal cancer.  Skin cancer.  Lung cancer. What should I know about heart disease, diabetes, and high blood  pressure? Blood pressure and heart disease  High blood pressure causes heart disease and increases the risk of stroke. This is more likely to develop in people who have high blood pressure readings, are of African descent, or are overweight.  Have your blood pressure checked: ? Every 3-5 years if you are 18-39 years of age. ? Every year if you are 40 years old or older. Diabetes Have regular diabetes screenings. This checks your fasting blood sugar level. Have the screening done:  Once every three years after age 40 if you are at a normal weight and have a low risk for diabetes.  More often and at a younger age if you are overweight or have a high risk for diabetes. What should I know about preventing infection? Hepatitis B If you have a higher risk for hepatitis B, you should be screened for this virus. Talk with your health care provider to find out if you are at risk for hepatitis B infection. Hepatitis C Testing is recommended for:  Everyone born from 1945 through 1965.  Anyone with known risk factors for hepatitis C. Sexually transmitted infections (STIs)  Get screened for STIs, including gonorrhea and chlamydia, if: ? You are sexually active and are younger than 44 years of age. ? You are older than 44 years of age and your health care provider tells you that you are at risk for this type of infection. ? Your sexual activity has changed since you were last screened, and you are at increased risk for chlamydia or gonorrhea. Ask your health care provider   if you are at risk.  Ask your health care provider about whether you are at high risk for HIV. Your health care provider may recommend a prescription medicine to help prevent HIV infection. If you choose to take medicine to prevent HIV, you should first get tested for HIV. You should then be tested every 3 months for as long as you are taking the medicine. Pregnancy  If you are about to stop having your period (premenopausal) and  you may become pregnant, seek counseling before you get pregnant.  Take 400 to 800 micrograms (mcg) of folic acid every day if you become pregnant.  Ask for birth control (contraception) if you want to prevent pregnancy. Osteoporosis and menopause Osteoporosis is a disease in which the bones lose minerals and strength with aging. This can result in bone fractures. If you are 65 years old or older, or if you are at risk for osteoporosis and fractures, ask your health care provider if you should:  Be screened for bone loss.  Take a calcium or vitamin D supplement to lower your risk of fractures.  Be given hormone replacement therapy (HRT) to treat symptoms of menopause. Follow these instructions at home: Lifestyle  Do not use any products that contain nicotine or tobacco, such as cigarettes, e-cigarettes, and chewing tobacco. If you need help quitting, ask your health care provider.  Do not use street drugs.  Do not share needles.  Ask your health care provider for help if you need support or information about quitting drugs. Alcohol use  Do not drink alcohol if: ? Your health care provider tells you not to drink. ? You are pregnant, may be pregnant, or are planning to become pregnant.  If you drink alcohol: ? Limit how much you use to 0-1 drink a day. ? Limit intake if you are breastfeeding.  Be aware of how much alcohol is in your drink. In the [place] one drink equals one 12 oz bottle of beer (355 mL), one 5 oz glass of wine (148 mL), or one 1 oz glass of hard liquor (44 mL). General instructions  Schedule regular health, dental, and eye exams.  Stay current with your vaccines.  Tell your health care provider if: ? You often feel depressed. ? You have ever been abused or do not feel safe at home. Summary  Adopting a healthy lifestyle and getting preventive care are important in promoting health and wellness.  Follow your health care provider's instructions about healthy  diet, exercising, and getting tested or screened for diseases.  Follow your health care provider's instructions on monitoring your cholesterol and blood pressure. This information is not intended to replace advice given to you by your health care provider. Make sure you discuss any questions you have with your health care provider. Document Revised: 03/22/2018 Document Reviewed: 03/22/2018 Elsevier Patient Education  2021 Elsevier Inc.  

## 2020-04-30 NOTE — Progress Notes (Signed)
Complete physical exam   Patient: Rebecca Powers   DOB: 27-Jul-1975   45 y.o. Female  MRN: [id number] Visit Date: 04/30/2020  Today's healthcare provider: Mar Daring, PA-C   Chief Complaint  Patient presents with  . Annual Exam   Subjective    Rebecca Powers is a 45 y.o. female who presents today for a complete physical exam.  She reports consuming a general diet. Gym/ health club routine includes cardio. She generally feels well. She reports sleeping well. She does have additional problems to discuss today.   Mouth Lesions  Associated symptoms include mouth sores. Pertinent negatives include no congestion, no ear discharge, no ear pain, no hearing loss, no rhinorrhea and no sore throat.   Mouth sores are blood blisters that seem to most often occur on the left lateral tongue edge. She reports once they start they continue to grow and she eventually has to pop them.  Past Medical History:  Diagnosis Date  . Allergy   . Bipolar disorder ([place])    Followed by Dr. Sheryle Hail  . Depression   . Hyperlipidemia    Past Surgical History:  Procedure Laterality Date  . none N/A    Social History   Socioeconomic History  . Marital status: Single    Spouse name: Not on file  . Number of children: Not on file  . Years of education: Not on file  . Highest education level: Not on file  Occupational History  . Not on file  Tobacco Use  . Smoking status: Never Smoker  . Smokeless tobacco: Never Used  Vaping Use  . Vaping Use: Never used  Substance and Sexual Activity  . Alcohol use: No    Alcohol/week: 0.0 standard drinks  . Drug use: No  . Sexual activity: Never    Birth control/protection: Pill    Comment: ovarian cyst and ,excess bleeding mensis  Other Topics Concern  . Not on file  Social History Narrative  . Not on file   Social Determinants of Health   Financial Resource Strain: Not on file  Food Insecurity: Not on file  Transportation Needs: Not  on file  Physical Activity: Not on file  Stress: Not on file  Social Connections: Not on file  Intimate Partner Violence: Not on file   Family Status  Relation Name Status  . MGM  Deceased at age 77       Believed to have died from colon Cancer  . MGF  Deceased       CAD  . PGF  Deceased       parkinsons disease  . Mother  Alive  . Father  Alive  . PGM  Deceased       Parkinson Disease, gastro issues,   . Brother  Alive  . Mat Aunt  Alive       unknown med hx  . Mat Nordstrom  . Ethlyn Daniels  Alive       unknown health hx  . Annamarie Major  Alive       unknown health hx  . Neg Hx  (Not Specified)   Family History  Problem Relation Age of Onset  . Hyperlipidemia Maternal Grandfather   . Heart disease Maternal Grandfather   . Dementia Maternal Grandfather   . Hyperlipidemia Paternal Grandfather   . Heart disease Paternal Grandfather   . Hyperlipidemia Mother   . Migraines Brother   . Hyperlipidemia Maternal Uncle   .  Breast cancer Neg Hx    No Known Allergies  Patient Care Team: Rubye Beach as PCP - General (Family Medicine)   Medications: Outpatient Medications Prior to Visit  Medication Sig  . Azelaic Acid (FINACEA EX) Apply topically.  Marland Kitchen ESTARYLLA 0.25-35 MG-MCG tablet TAKE 1 TABLET BY MOUTH DAILY . PLEASE SCHEDULE OFFICE VISIT BEFORE ANY FUTURE REFILLS  . lamoTRIgine (LAMICTAL) 100 MG tablet    No facility-administered medications prior to visit.    Review of Systems  Constitutional: Negative.   HENT: Positive for mouth sores. Negative for congestion, dental problem, drooling, ear discharge, ear pain, facial swelling, hearing loss, nosebleeds, postnasal drip, rhinorrhea, sinus pressure, sinus pain, sneezing, sore throat, tinnitus, trouble swallowing and voice change.   Eyes: Negative.   Respiratory: Negative.   Cardiovascular: Negative.   Gastrointestinal: Negative.   Endocrine: Negative.   Genitourinary: Negative.   Musculoskeletal: Negative.    Skin: Negative.   Allergic/Immunologic: Negative.   Neurological: Negative.   Hematological: Negative.   Psychiatric/Behavioral: Negative.     Last CBC Lab Results  Component Value Date   WBC 4.9 04/17/2019   HGB 14.1 04/17/2019   HCT 42.4 04/17/2019   MCV 92 04/17/2019   MCH 30.6 04/17/2019   RDW 11.7 04/17/2019   PLT 168 63/04/6008   Last metabolic panel Lab Results  Component Value Date   GLUCOSE 92 04/17/2019   NA 140 04/17/2019   K 4.5 04/17/2019   CL 102 04/17/2019   CO2 25 04/17/2019   BUN 11 04/17/2019   CREATININE 1.03 (H) 04/17/2019   GFRNONAA 67 04/17/2019   GFRAA 77 04/17/2019   CALCIUM 9.5 04/17/2019   PHOS 3.4 09/15/2016   PROT 7.1 04/17/2019   ALBUMIN 4.6 04/17/2019   LABGLOB 2.5 04/17/2019   AGRATIO 1.8 04/17/2019   BILITOT 0.5 04/17/2019   ALKPHOS 51 04/17/2019   AST 23 04/17/2019   ALT 15 04/17/2019      Objective    BP 125/79 (BP Location: Left Arm, Patient Position: Sitting, Cuff Size: Normal)   Pulse 81   Temp 98.6 F (37 C) (Oral)   Wt 143 lb (64.9 kg)   BMI 23.08 kg/m  BP Readings from Last 3 Encounters:  04/30/20 125/79  03/29/19 135/78  05/31/18 135/80   Wt Readings from Last 3 Encounters:  04/30/20 143 lb (64.9 kg)  03/29/19 147 lb 1.6 oz (66.7 kg)  05/31/18 140 lb (63.5 kg)      Physical Exam Vitals reviewed.  Constitutional:      General: She is not in acute distress.    Appearance: Normal appearance. She is well-developed, normal weight and well-nourished. She is not ill-appearing or diaphoretic.  HENT:     Head: Normocephalic and atraumatic.     Right Ear: Tympanic membrane, ear canal and external ear normal.     Left Ear: Tympanic membrane, ear canal and external ear normal.     Nose: Nose normal.     Mouth/Throat:     Lips: Pink. No lesions.     Mouth: Oropharynx is clear and moist. Mucous membranes are moist.     Tongue: Lesions present.     Pharynx: Oropharynx is clear. No oropharyngeal exudate or  posterior oropharyngeal erythema.   Eyes:     General: No scleral icterus.       Right eye: No discharge.        Left eye: No discharge.     Extraocular Movements: Extraocular movements intact and EOM normal.  Conjunctiva/sclera: Conjunctivae normal.     Pupils: Pupils are equal, round, and reactive to light.  Neck:     Thyroid: No thyromegaly.     Vascular: No JVD.     Trachea: No tracheal deviation.  Cardiovascular:     Rate and Rhythm: Normal rate and regular rhythm.     Pulses: Normal pulses and intact distal pulses.     Heart sounds: Normal heart sounds. No murmur heard. No friction rub. No gallop.   Pulmonary:     Effort: Pulmonary effort is normal. No respiratory distress.     Breath sounds: Normal breath sounds. No wheezing or rales.  Chest:     Chest wall: No tenderness.  Abdominal:     General: Abdomen is flat. Bowel sounds are normal. There is no distension.     Palpations: Abdomen is soft. There is no mass.     Tenderness: There is no abdominal tenderness. There is no guarding or rebound.  Musculoskeletal:        General: No tenderness. Normal range of motion.     Cervical back: Normal range of motion and neck supple. No tenderness.     Right lower leg: No edema.     Left lower leg: No edema.  Lymphadenopathy:     Cervical: No cervical adenopathy.  Skin:    General: Skin is warm and dry.     Capillary Refill: Capillary refill takes less than 2 seconds.     Findings: No rash.  Neurological:     General: No focal deficit present.     Mental Status: She is alert and oriented to person, place, and time. Mental status is at baseline.     Motor: No weakness.     Gait: Gait normal.  Psychiatric:        Mood and Affect: Mood and affect and mood normal.        Behavior: Behavior normal.        Thought Content: Thought content normal.        Judgment: Judgment normal.       Last depression screening scores PHQ 2/9 Scores 04/30/2020 03/29/2019 10/17/2017  PHQ -  2 Score 0 0 0  PHQ- 9 Score 0 - -   Last fall risk screening Fall Risk  04/30/2020  Falls in the past year? 0  Number falls in past yr: 0  Injury with Fall? 0  Risk for fall due to : No Fall Risks  Follow up Falls evaluation completed   Last Audit-C alcohol use screening Alcohol Use Disorder Test (AUDIT) 04/30/2020  1. How often do you have a drink containing alcohol? 0  2. How many drinks containing alcohol do you have on a typical day when you are drinking? 0  3. How often do you have six or more drinks on one occasion? 0  AUDIT-C Score 0  Alcohol Brief Interventions/Follow-up AUDIT Score <7 follow-up not indicated   A score of 3 or more in women, and 4 or more in men indicates increased risk for alcohol abuse, EXCEPT if all of the points are from question 1   No results found for any visits on 04/30/20.  Assessment & Plan    Routine Health Maintenance and Physical Exam  Exercise Activities and Dietary recommendations Goals   None     Immunization History  Administered Date(s) Administered  . Hepatitis A 03/21/2009, 09/23/2009  . Influenza Split 03/21/2009  . Influenza,inj,Quad PF,6+ Mos 12/27/2018  . Influenza-Unspecified 01/03/2017, 01/10/2018, 01/02/2020  .  Tdap 06/02/2006, 10/05/2016    Health Maintenance  Topic Date Due  . Hepatitis C Screening  Never done  . MAMMOGRAM  11/07/2020  . PAP SMEAR-Modifier  10/18/2022  . TETANUS/TDAP  10/06/2026  . INFLUENZA VACCINE  Completed  . HIV Screening  Completed    Discussed health benefits of physical activity, and encouraged her to engage in regular exercise appropriate for her age and condition.  1. Annual physical exam Normal physical exam today. Will check labs as below and f/u pending lab results. If labs are stable and WNL she will not need to have these rechecked for one year at her next annual physical exam. She is to call the office in the meantime if she has any acute issue, questions or concerns. - CBC  w/Diff/Platelet - Comprehensive Metabolic Panel (CMET) - Lipid Panel With LDL/HDL Ratio - TSH - HgB A1c  2. Thrombocytopenia (HCC) No obvious bruising or bleeding. Will check labs as below and f/u pending results. - CBC w/Diff/Platelet - Comprehensive Metabolic Panel (CMET)  3. Pure hypercholesterolemia H/O this. Diet controlled. Will check labs as below and f/u pending results. - Comprehensive Metabolic Panel (CMET) - Lipid Panel With LDL/HDL Ratio  4. Bipolar disorder, in full remission, most recent episode depressed ([place]) Stable. Followed by Psychiatry.   5. Avitaminosis D H/O this. Will check labs as below and f/u pending results. - CBC w/Diff/Platelet - Vitamin D (25 hydroxy)  6. Blood blister Blood blister on tongue. Will check labs as below and f/u pending results for possible cause of this. If all labs are unremarkable may consider ENT referral.  - CBC w/Diff/Platelet - B12 and Folate Panel - Vitamin D (25 hydroxy) - Fe+TIBC+Fer  7. Encounter for hepatitis C screening test for low risk patient Will check labs as below and f/u pending results. - Hepatitis C Antibody   No follow-ups on file.     Reynolds Bowl, PA-C, have reviewed all documentation for this visit. The documentation on 04/30/20 for the exam, diagnosis, procedures, and orders are all accurate and complete.   Rubye Beach  [facility] [phone] (phone) [phone] (fax)  [place]

## 2020-05-06 ENCOUNTER — Encounter: Payer: Self-pay | Admitting: Physician Assistant

## 2020-05-13 ENCOUNTER — Other Ambulatory Visit: Payer: Self-pay | Admitting: Medical

## 2020-05-13 ENCOUNTER — Other Ambulatory Visit: Payer: Self-pay

## 2020-05-13 DIAGNOSIS — Z0000 Encounter for general adult medical examination without abnormal findings: Secondary | ICD-10-CM

## 2020-05-13 DIAGNOSIS — Z1159 Encounter for screening for other viral diseases: Secondary | ICD-10-CM

## 2020-05-13 DIAGNOSIS — D696 Thrombocytopenia, unspecified: Secondary | ICD-10-CM

## 2020-05-13 DIAGNOSIS — E7800 Pure hypercholesterolemia, unspecified: Secondary | ICD-10-CM

## 2020-05-13 DIAGNOSIS — E559 Vitamin D deficiency, unspecified: Secondary | ICD-10-CM

## 2020-05-13 DIAGNOSIS — T148XXA Other injury of unspecified body region, initial encounter: Secondary | ICD-10-CM

## 2020-05-14 LAB — HGB A1C W/O EAG: Hgb A1c MFr Bld: 5.2 % (ref 4.8–5.6)

## 2020-05-14 LAB — LIPID PANEL WITH LDL/HDL RATIO
Cholesterol, Total: 274 mg/dL — ABNORMAL HIGH (ref 100–199)
HDL: 90 mg/dL (ref >39)
LDL Chol Calc (NIH): 167 mg/dL — ABNORMAL HIGH (ref 0–99)
LDL/HDL Ratio: 1.9 ratio (ref 0.0–3.2)
Triglycerides: 100 mg/dL (ref 0–149)
VLDL Cholesterol Cal: 17 mg/dL (ref 5–40)

## 2020-05-14 LAB — COMPREHENSIVE METABOLIC PANEL
ALT: 21 IU/L (ref 0–32)
AST: 26 IU/L (ref 0–40)
Albumin/Globulin Ratio: 1.8 (ref 1.2–2.2)
Albumin: 4.8 g/dL (ref 3.8–4.8)
Alkaline Phosphatase: 52 IU/L (ref 44–121)
BUN/Creatinine Ratio: 10 (ref 9–23)
BUN: 10 mg/dL (ref 6–24)
Bilirubin Total: 0.7 mg/dL (ref 0.0–1.2)
CO2: 24 mmol/L (ref 20–29)
Calcium: 10 mg/dL (ref 8.7–10.2)
Chloride: 99 mmol/L (ref 96–106)
Creatinine, Ser: 0.98 mg/dL (ref 0.57–1.00)
GFR calc Af Amer: 82 mL/min/{1.73_m2} (ref >59)
GFR calc non Af Amer: 71 mL/min/{1.73_m2} (ref >59)
Globulin, Total: 2.6 g/dL (ref 1.5–4.5)
Glucose: 91 mg/dL (ref 65–99)
Potassium: 4.6 mmol/L (ref 3.5–5.2)
Sodium: 136 mmol/L (ref 134–144)
Total Protein: 7.4 g/dL (ref 6.0–8.5)

## 2020-05-14 LAB — CBC WITH DIFFERENTIAL/PLATELET
Basophils Absolute: 0 10*3/uL (ref 0.0–0.2)
Basos: 1 %
EOS (ABSOLUTE): 0.1 10*3/uL (ref 0.0–0.4)
Eos: 1 %
Hematocrit: 45.7 % (ref 34.0–46.6)
Hemoglobin: 14.8 g/dL (ref 11.1–15.9)
Immature Grans (Abs): 0 10*3/uL (ref 0.0–0.1)
Immature Granulocytes: 0 %
Lymphocytes Absolute: 1.4 10*3/uL (ref 0.7–3.1)
Lymphs: 25 %
MCH: 30 pg (ref 26.6–33.0)
MCHC: 32.4 g/dL (ref 31.5–35.7)
MCV: 93 fL (ref 79–97)
Monocytes Absolute: 0.4 10*3/uL (ref 0.1–0.9)
Monocytes: 7 %
Neutrophils Absolute: 3.7 10*3/uL (ref 1.4–7.0)
Neutrophils: 66 %
Platelets: 193 10*3/uL (ref 150–450)
RBC: 4.93 x10E6/uL (ref 3.77–5.28)
RDW: 11.8 % (ref 11.7–15.4)
WBC: 5.5 10*3/uL (ref 3.4–10.8)

## 2020-05-14 LAB — IRON AND TIBC
Iron Saturation: 60 % — ABNORMAL HIGH (ref 15–55)
Iron: 238 ug/dL — ABNORMAL HIGH (ref 27–159)
Total Iron Binding Capacity: 395 ug/dL (ref 250–450)
UIBC: 157 ug/dL (ref 131–425)

## 2020-05-14 LAB — B12 AND FOLATE PANEL
Folate: 19.9 ng/mL (ref >3.0)
Vitamin B-12: 526 pg/mL (ref 232–1245)

## 2020-05-14 LAB — FERRITIN: Ferritin: 101 ng/mL (ref 15–150)

## 2020-05-14 LAB — HEPATITIS C ANTIBODY: Hep C Virus Ab: 0.1 s/co ratio (ref 0.0–0.9)

## 2020-05-14 LAB — TSH: TSH: 4.01 u[IU]/mL (ref 0.450–4.500)

## 2020-05-14 LAB — VITAMIN D 25 HYDROXY (VIT D DEFICIENCY, FRACTURES): Vit D, 25-Hydroxy: 37.9 ng/mL (ref 30.0–100.0)

## 2020-05-16 ENCOUNTER — Encounter: Payer: Self-pay | Admitting: Physician Assistant

## 2020-09-01 DIAGNOSIS — F3181 Bipolar II disorder: Secondary | ICD-10-CM | POA: Diagnosis not present

## 2020-10-17 ENCOUNTER — Other Ambulatory Visit: Payer: Self-pay | Admitting: Physician Assistant

## 2020-10-17 DIAGNOSIS — Z3041 Encounter for surveillance of contraceptive pills: Secondary | ICD-10-CM

## 2020-10-21 MED ORDER — NORGESTIMATE-ETH ESTRADIOL 0.25-35 MG-MCG PO TABS: 1.0000 | ORAL_TABLET | Freq: Every day | ORAL | 1 refills | Status: DC

## 2020-11-14 ENCOUNTER — Other Ambulatory Visit: Payer: Self-pay | Admitting: Family Medicine

## 2020-11-14 ENCOUNTER — Telehealth: Payer: Self-pay

## 2020-11-14 DIAGNOSIS — Z1231 Encounter for screening mammogram for malignant neoplasm of breast: Secondary | ICD-10-CM

## 2020-11-14 DIAGNOSIS — E7800 Pure hypercholesterolemia, unspecified: Secondary | ICD-10-CM

## 2020-11-14 NOTE — Telephone Encounter (Signed)
That's fine

## 2020-11-14 NOTE — Telephone Encounter (Signed)
Are we able to do labs this way? 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 She does not have a f/u scheduled.

## 2020-11-14 NOTE — Telephone Encounter (Signed)
Copied from [place] [id number]. Topic: General - Other >> Nov 14, 2020 10:13 AM Celene Kras wrote: Reason for CRM: Pt called stating that she is needing to have her cholesterol rechecked. She states that she would like to have this done before setting up a follow up appt. Pt is requesting to have lab orders faxed to her job so that she is able to get them done there. Please advise.     Fax# [phone]

## 2020-11-17 NOTE — Telephone Encounter (Signed)
Lab order faxed.

## 2020-12-02 ENCOUNTER — Other Ambulatory Visit: Payer: Self-pay

## 2020-12-02 DIAGNOSIS — Z1159 Encounter for screening for other viral diseases: Secondary | ICD-10-CM

## 2020-12-02 DIAGNOSIS — D696 Thrombocytopenia, unspecified: Secondary | ICD-10-CM

## 2020-12-02 DIAGNOSIS — E559 Vitamin D deficiency, unspecified: Secondary | ICD-10-CM

## 2020-12-02 DIAGNOSIS — T148XXA Other injury of unspecified body region, initial encounter: Secondary | ICD-10-CM

## 2020-12-02 DIAGNOSIS — E7800 Pure hypercholesterolemia, unspecified: Secondary | ICD-10-CM

## 2020-12-02 DIAGNOSIS — Z0000 Encounter for general adult medical examination without abnormal findings: Secondary | ICD-10-CM

## 2020-12-03 LAB — COMPREHENSIVE METABOLIC PANEL
ALT: 15 IU/L (ref 0–32)
AST: 23 IU/L (ref 0–40)
Albumin/Globulin Ratio: 2.4 — ABNORMAL HIGH (ref 1.2–2.2)
Albumin: 4.8 g/dL (ref 3.8–4.8)
Alkaline Phosphatase: 42 IU/L — ABNORMAL LOW (ref 44–121)
BUN/Creatinine Ratio: 11 (ref 9–23)
BUN: 10 mg/dL (ref 6–24)
Bilirubin Total: 0.5 mg/dL (ref 0.0–1.2)
CO2: 23 mmol/L (ref 20–29)
Calcium: 9.7 mg/dL (ref 8.7–10.2)
Chloride: 100 mmol/L (ref 96–106)
Creatinine, Ser: 0.92 mg/dL (ref 0.57–1.00)
Globulin, Total: 2 g/dL (ref 1.5–4.5)
Glucose: 89 mg/dL (ref 65–99)
Potassium: 4.6 mmol/L (ref 3.5–5.2)
Sodium: 138 mmol/L (ref 134–144)
Total Protein: 6.8 g/dL (ref 6.0–8.5)
eGFR: 79 mL/min/{1.73_m2} (ref >59)

## 2020-12-03 LAB — CBC WITH DIFFERENTIAL/PLATELET
Basophils Absolute: 0 10*3/uL (ref 0.0–0.2)
Basos: 1 %
EOS (ABSOLUTE): 0.3 10*3/uL (ref 0.0–0.4)
Eos: 5 %
Hematocrit: 43.8 % (ref 34.0–46.6)
Hemoglobin: 14.5 g/dL (ref 11.1–15.9)
Immature Grans (Abs): 0 10*3/uL (ref 0.0–0.1)
Immature Granulocytes: 0 %
Lymphocytes Absolute: 1.3 10*3/uL (ref 0.7–3.1)
Lymphs: 25 %
MCH: 30.9 pg (ref 26.6–33.0)
MCHC: 33.1 g/dL (ref 31.5–35.7)
MCV: 93 fL (ref 79–97)
Monocytes Absolute: 0.3 10*3/uL (ref 0.1–0.9)
Monocytes: 6 %
Neutrophils Absolute: 3.4 10*3/uL (ref 1.4–7.0)
Neutrophils: 63 %
Platelets: 146 10*3/uL — ABNORMAL LOW (ref 150–450)
RBC: 4.69 x10E6/uL (ref 3.77–5.28)
RDW: 11.7 % (ref 11.7–15.4)
WBC: 5.3 10*3/uL (ref 3.4–10.8)

## 2020-12-03 LAB — IRON AND TIBC
Iron Saturation: 38 % (ref 15–55)
Iron: 155 ug/dL (ref 27–159)
Total Iron Binding Capacity: 404 ug/dL (ref 250–450)
UIBC: 249 ug/dL (ref 131–425)

## 2020-12-03 LAB — LIPID PANEL
Chol/HDL Ratio: 2.9 ratio (ref 0.0–4.4)
Cholesterol, Total: 230 mg/dL — ABNORMAL HIGH (ref 100–199)
HDL: 79 mg/dL (ref >39)
LDL Chol Calc (NIH): 135 mg/dL — ABNORMAL HIGH (ref 0–99)
Triglycerides: 91 mg/dL (ref 0–149)
VLDL Cholesterol Cal: 16 mg/dL (ref 5–40)

## 2020-12-03 LAB — B12 AND FOLATE PANEL
Folate: >20 ng/mL (ref >3.0)
Vitamin B-12: 528 pg/mL (ref 232–1245)

## 2020-12-03 LAB — TSH: TSH: 3.47 u[IU]/mL (ref 0.450–4.500)

## 2020-12-03 LAB — HEPATITIS C ANTIBODY: Hep C Virus Ab: <0.1 s/co ratio (ref 0.0–0.9)

## 2020-12-03 LAB — FERRITIN: Ferritin: 58 ng/mL (ref 15–150)

## 2020-12-03 LAB — VITAMIN D 25 HYDROXY (VIT D DEFICIENCY, FRACTURES): Vit D, 25-Hydroxy: 38 ng/mL (ref 30.0–100.0)

## 2020-12-03 LAB — HGB A1C W/O EAG: Hgb A1c MFr Bld: 5.2 % (ref 4.8–5.6)

## 2020-12-08 ENCOUNTER — Ambulatory Visit: Payer: Self-pay | Admitting: *Deleted

## 2020-12-08 NOTE — Telephone Encounter (Signed)
Reason for Disposition  [1] Follow-up call to recent contact AND [2] information only call, no triage required    Lab results given  Answer Assessment - Initial Assessment Questions 1. REASON FOR CALL or QUESTION: "What is your reason for calling today?" or "How can I best help you?" or "What question do you have that I can help answer?"     Pt called in and was given her lab result message from Dr. Brita Romp.   She had read the results on MyChart and did not have any questions   She is going to have her repeat CBC done at [facility] and have them faxed to Dr. Brita Romp in a month.  Protocols used: Information Only Call - No Triage-A-AH

## 2020-12-08 NOTE — Telephone Encounter (Signed)
Pt returned call and was given her lab result message.   "I saw my note on MyChart from Dr. Brita Romp about my labs".   No questions. See triage notes for details and repeat CBC plans.

## 2020-12-18 IMAGING — MG DIGITAL SCREENING BILAT W/ TOMO W/ CAD
8 series · 9 of 24 positions shown · non-contrast
Comparison: Previous exam(s).

CLINICAL DATA: Screening.

EXAM:
DIGITAL SCREENING BILATERAL MAMMOGRAM WITH TOMO AND CAD

[L MLO synth-2D]
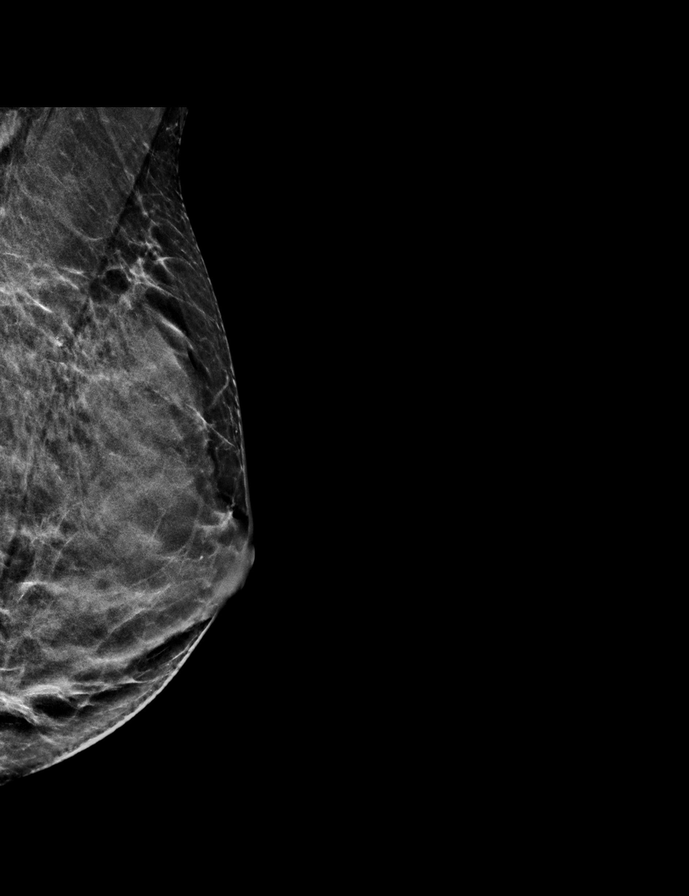

[R CC synth-2D]
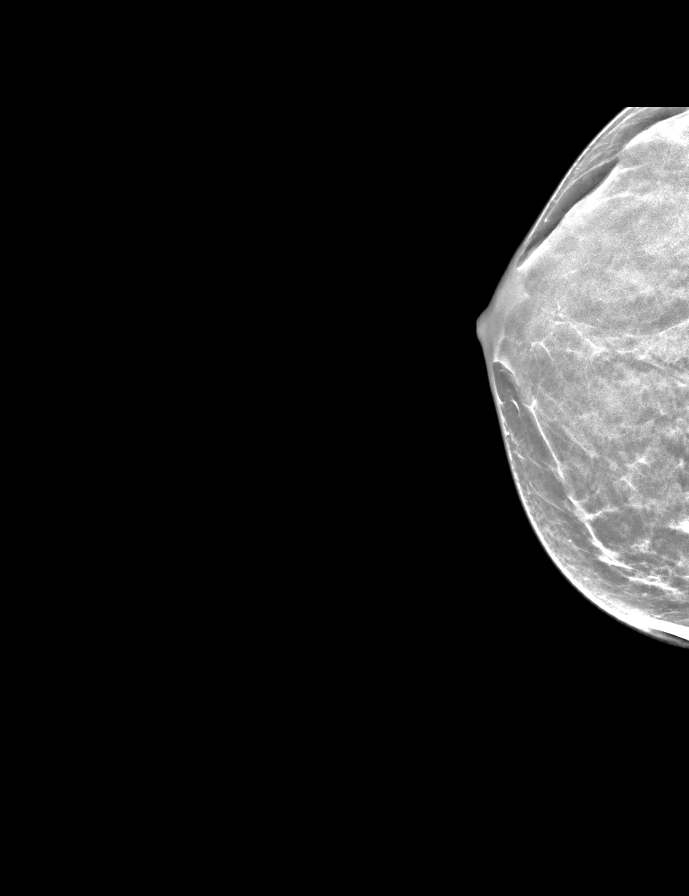

[R MLO synth-2D]
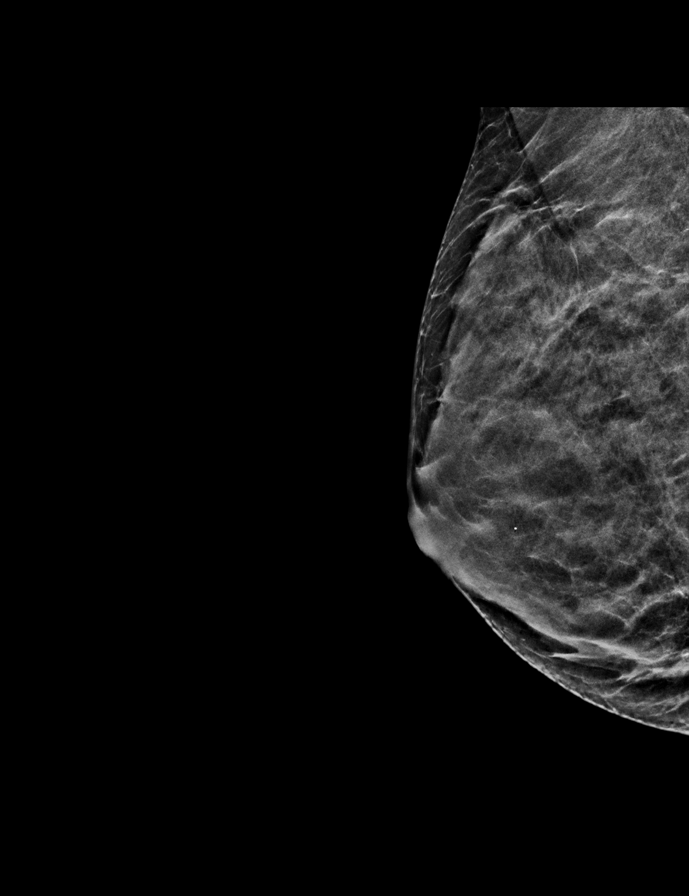

[L CC synth-2D]
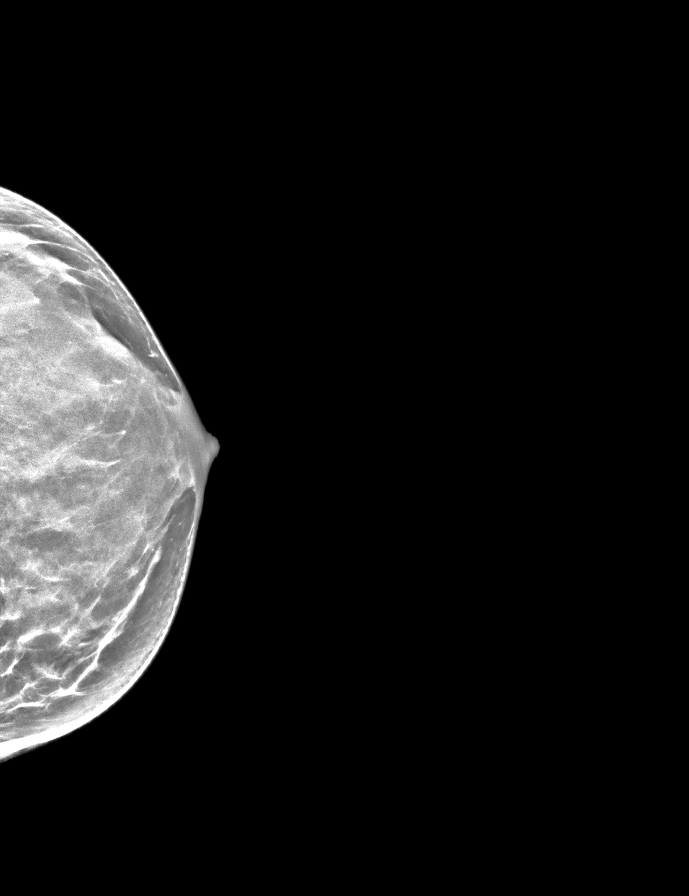

[L CC tomo · 2 of 47 frames shown]
[frame 16/47]
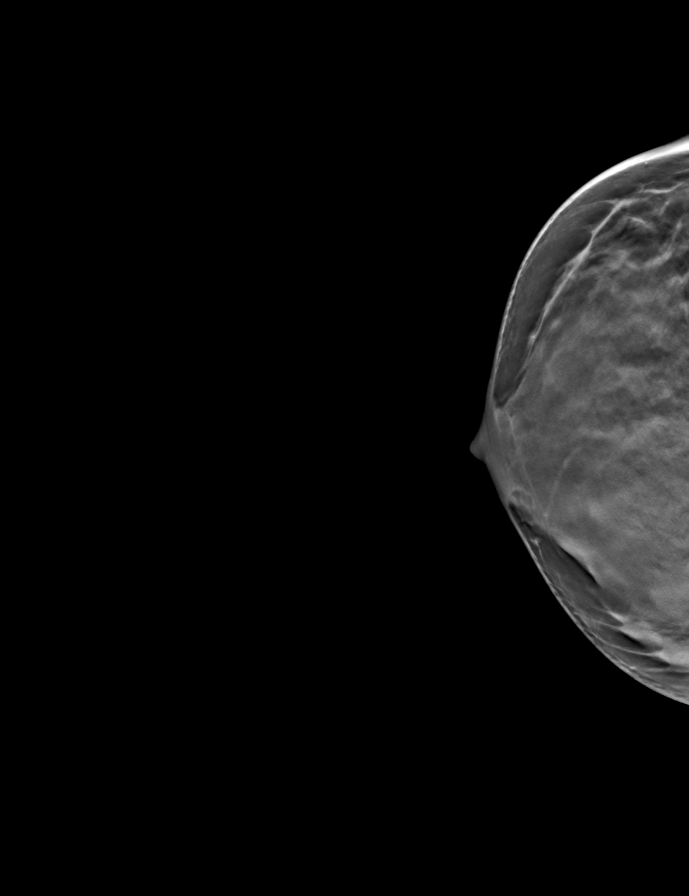
[frame 24/47]
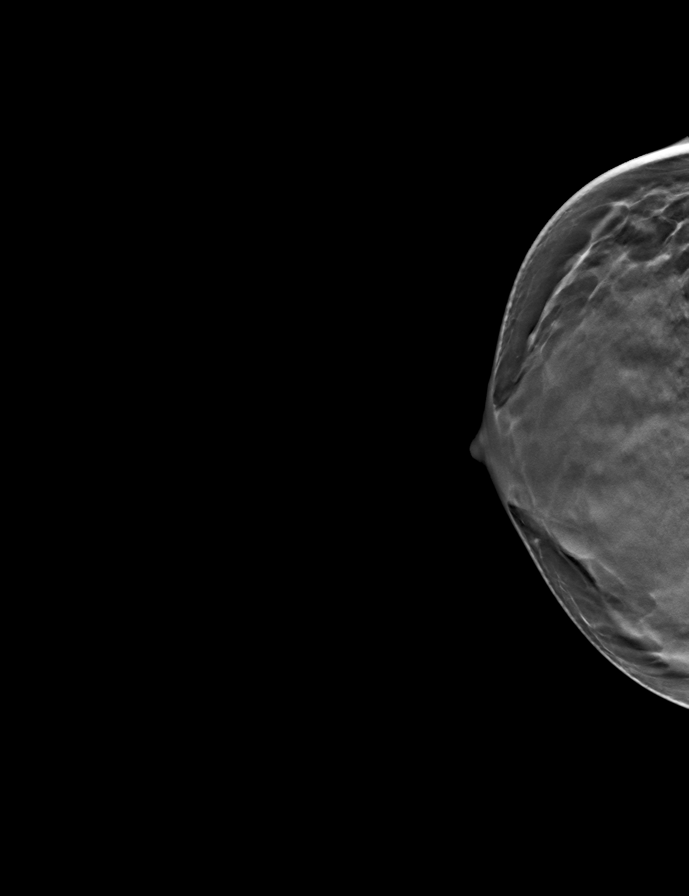

[L MLO tomo · tomo slice 24/47.0]
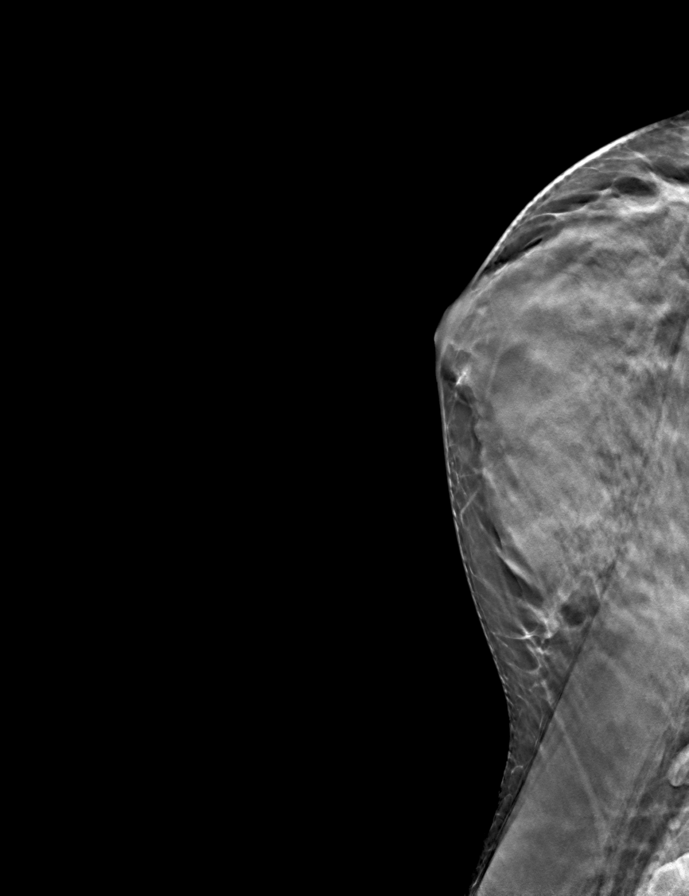

[R MLO tomo · tomo slice 22/43.0]
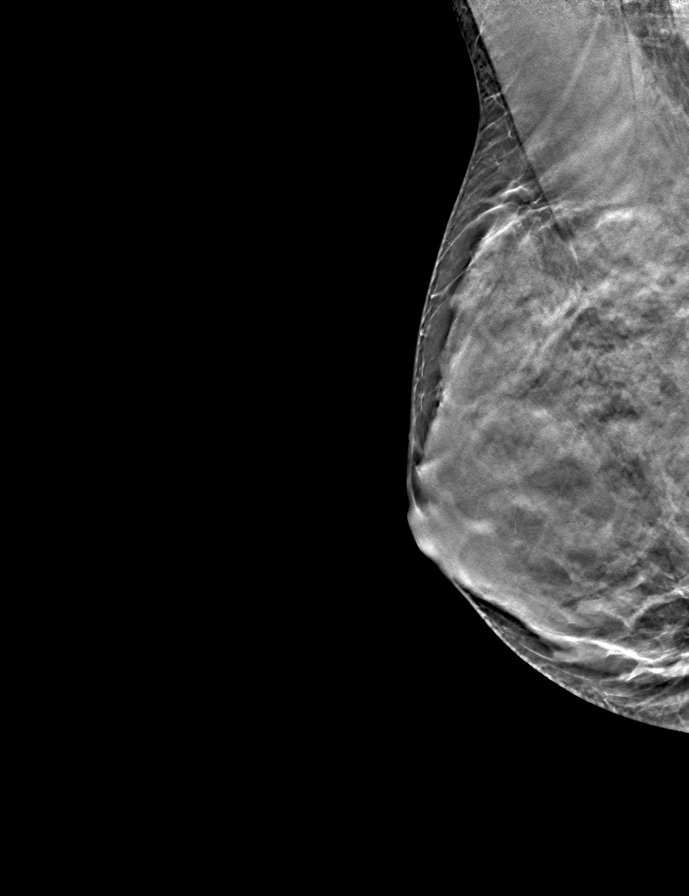

[R CC tomo · tomo slice 23/44.0]
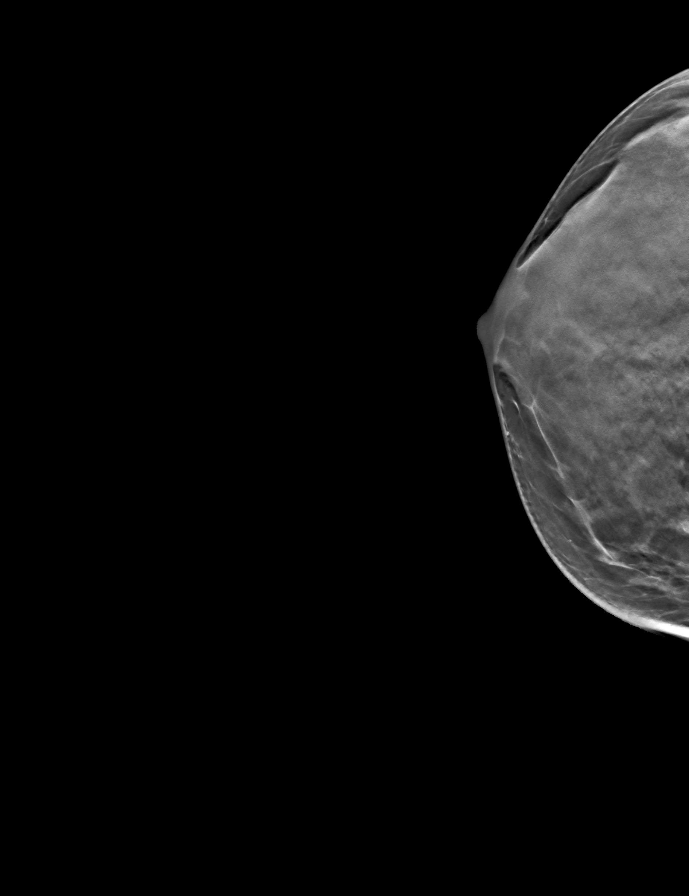

[9 of 24 positions shown; findings below may reference images not displayed]

ACR Breast Density Category d: The breast tissue is extremely dense,
which lowers the sensitivity of mammography
FINDINGS: There are no findings suspicious for malignancy. Images were
processed with CAD.
IMPRESSION: No mammographic evidence of malignancy. A result letter of this
screening mammogram will be mailed directly to the patient.

RECOMMENDATION:
Screening mammogram in one year. (Code:WO-[id number])

BI-RADS CATEGORY  1: Negative.

## 2020-12-22 ENCOUNTER — Other Ambulatory Visit: Payer: Self-pay

## 2020-12-22 ENCOUNTER — Ambulatory Visit
Admission: RE | Admit: 2020-12-22 | Discharge: 2020-12-22 | Disposition: A | Discharge status: Home / Self Care | Payer: BC Managed Care – PPO | Source: Ambulatory Visit | Attending: Family Medicine | Admitting: Family Medicine

## 2020-12-22 DIAGNOSIS — Z1231 Encounter for screening mammogram for malignant neoplasm of breast: Secondary | ICD-10-CM | POA: Diagnosis not present

## 2020-12-31 ENCOUNTER — Ambulatory Visit: Payer: BC Managed Care – PPO

## 2020-12-31 ENCOUNTER — Other Ambulatory Visit: Payer: Self-pay

## 2020-12-31 DIAGNOSIS — Z23 Encounter for immunization: Secondary | ICD-10-CM

## 2021-02-09 ENCOUNTER — Ambulatory Visit: Payer: BC Managed Care – PPO | Admitting: Dermatology

## 2021-02-09 ENCOUNTER — Other Ambulatory Visit: Payer: Self-pay

## 2021-02-09 DIAGNOSIS — L719 Rosacea, unspecified: Secondary | ICD-10-CM | POA: Diagnosis not present

## 2021-02-09 DIAGNOSIS — L814 Other melanin hyperpigmentation: Secondary | ICD-10-CM

## 2021-02-09 DIAGNOSIS — L578 Other skin changes due to chronic exposure to nonionizing radiation: Secondary | ICD-10-CM | POA: Diagnosis not present

## 2021-02-09 DIAGNOSIS — Z1283 Encounter for screening for malignant neoplasm of skin: Secondary | ICD-10-CM

## 2021-02-09 DIAGNOSIS — D225 Melanocytic nevi of trunk: Secondary | ICD-10-CM | POA: Diagnosis not present

## 2021-02-09 DIAGNOSIS — D1800 Hemangioma unspecified site: Secondary | ICD-10-CM

## 2021-02-09 DIAGNOSIS — L821 Other seborrheic keratosis: Secondary | ICD-10-CM

## 2021-02-09 DIAGNOSIS — L813 Cafe au lait spots: Secondary | ICD-10-CM

## 2021-02-09 DIAGNOSIS — D229 Melanocytic nevi, unspecified: Secondary | ICD-10-CM

## 2021-02-09 NOTE — Progress Notes (Signed)
   Follow-Up Visit   Subjective  Rebecca Powers is a 45 y.o. female who presents for the following: Annual Exam.  Patient here for TBSE. No new or changing spots noticed. She uses Azelaic Acid Cream for Rosacea which helps.  The following portions of the chart were reviewed this encounter and updated as appropriate:       Review of Systems:  No other skin or systemic complaints except as noted in HPI or Assessment and Plan.  Objective  Well appearing patient in no apparent distress; mood and affect are within normal limits.  A full examination was performed including scalp, head, eyes, ears, nose, lips, neck, chest, axillae, abdomen, back, buttocks, bilateral upper extremities, bilateral lower extremities, hands, feet, fingers, toes, fingernails, and toenails. All findings within normal limits unless otherwise noted below.  R buttock, L lateral calf Left Lateral Calf: 2.5 mm light brown macule   Right Buttock: 5.0 mm medium light brown flat papule    face Erythema with telangiectasias of the malar cheeks.   Assessment & Plan  Skin cancer screening performed today.  Actinic Damage - chronic, secondary to cumulative UV radiation exposure/sun exposure over time - diffuse scaly erythematous macules with underlying dyspigmentation - Recommend daily broad spectrum sunscreen SPF 30+ to sun-exposed areas, reapply every 2 hours as needed.  - Recommend staying in the shade or wearing long sleeves, sun glasses (UVA+UVB protection) and wide brim hats (4-inch brim around the entire circumference of the hat). - Call for new or changing lesions.  Lentigines - Scattered tan macules - Due to sun exposure - Benign-appering, observe - Recommend daily broad spectrum sunscreen SPF 30+ to sun-exposed areas, reapply every 2 hours as needed. - Call for any changes  Nevus R buttock, L lateral calf  Benign-appearing.  Stable. Observation.  Call clinic for new or changing moles.  Recommend  daily use of broad spectrum spf 30+ sunscreen to sun-exposed areas.   Rosacea face  Controlled  Rosacea is a chronic progressive skin condition usually affecting the face of adults, causing redness and/or acne bumps. It is treatable but not curable. It sometimes affects the eyes (ocular rosacea) as well. It may respond to topical and/or systemic medication and can flare with stress, sun exposure, alcohol, exercise and some foods.  Daily application of broad spectrum spf 30+ sunscreen to face is recommended to reduce flares.   Continue Azelaic Acid daily. Patient has at home, will call for refills.   Discussed BBL treatments, not covered by insurance.   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   Seborrheic Keratoses - Stuck-on, waxy, tan macules - Benign-appearing - Discussed benign etiology and prognosis. - Observe - Call for any changes   Return in about 1 year (around 02/09/2022) for TBSE.  IJamesetta [place], CMA, am acting as scribe for Brendolyn Patty, MD . Documentation: I have reviewed the above documentation for accuracy and completeness, and I agree with the above.  Brendolyn Patty MD

## 2021-02-09 NOTE — Patient Instructions (Addendum)
Rosacea  What is rosacea? Rosacea (say: ro-zay-sha) is a common skin disease that usually begins as a trend of flushing or blushing easily.  As rosacea progresses, a persistent redness in the center of the face will develop and may gradually spread beyond the nose and cheeks to the forehead and chin.  In some cases, the ears, chest, and back could be affected.  Rosacea may appear as tiny blood vessels or small red bumps that occur in crops.  Frequently they can contain pus, and are called "pustules".  If the bumps do not contain pus, they are referred to as "papules".  Rarely, in prolonged, untreated cases of rosacea, the oil glands of the nose and cheeks may become permanently enlarged.  This is called rhinophyma, and is seen more frequently in men.  Signs and Risks In its beginning stages, rosacea tends to come and go, which makes it difficult to recognize.  It can start as intermittent flushing of the face.  Eventually, blood vessels may become permanently visible.  Pustules and papules can appear, but can be mistaken for adult acne.  People of all races, ages, genders and ethnic groups are at risk of developing rosacea.  However, it is more common in women (especially around menopause) and adults with fair skin between the ages of 3 and 87.  Treatment Dermatologists typically recommend a combination of treatments to effectively manage rosacea.  Treatment can improve symptoms and may stop the progression of the rosacea.  Treatment may involve both topical and oral medications.  The tetracycline antibiotics are often used for their anti-inflammatory effect; however, because of the possibility of developing antibiotic resistance, they should not be used long term at full dose.  For dilated blood vessels the options include electrodessication (uses electric current through a small needle), laser treatment, and cosmetics to hide the redness.   With all forms of treatment, improvement is a slow process, and  patients may not see any results for the first 3-4 weeks.  It is very important to avoid the sun and other triggers.  Patients must wear sunscreen daily.  Skin Care Instructions: Cleanse the skin with a mild soap such as CeraVe cleanser, Cetaphil cleanser, or Dove soap once or twice daily as needed. Moisturize with Eucerin Redness Relief Daily Perfecting Lotion (has a subtle green tint), CeraVe Moisturizing Cream, or Oil of Olay Daily Moisturizer with sunscreen every morning and/or night as recommended. Makeup should be "non-comedogenic" (won't clog pores) and be labeled "for sensitive skin". Good choices for cosmetics are: Neutrogena, Almay, and Physician's Formula.  Any product with a green tint tends to offset a red complexion. If your eyes are dry and irritated, use artificial tears 2-3 times per day and cleanse the eyelids daily with baby shampoo.  Have your eyes examined at least every 2 years.  Be sure to tell your eye doctor that you have rosacea. Alcoholic beverages tend to cause flushing of the skin, and may make rosacea worse. Always wear sunscreen, protect your skin from extreme hot and cold temperatures, and avoid spicy foods, hot drinks, and mechanical irritation such as rubbing, scrubbing, or massaging the face.  Avoid harsh skin cleansers, cleansing masks, astringents, and exfoliation. If a particular product burns or makes your face feel tight, then it is likely to flare your rosacea. If you are having difficulty finding a sunscreen that you can tolerate, you may try switching to a chemical-free sunscreen.  These are ones whose active ingredient is zinc oxide or titanium dioxide  only.  They should also be fragrance free, non-comedogenic, and labeled for sensitive skin. Rosacea triggers may vary from person to person.  There are a variety of foods that have been reported to trigger rosacea.  Some patients find that keeping a diary of what they were doing when they flared helps them avoid  triggers.   Melanoma ABCDEs  Melanoma is the most dangerous type of skin cancer, and is the leading cause of death from skin disease.  You are more likely to develop melanoma if you: Have light-colored skin, light-colored eyes, or red or blond hair Spend a lot of time in the sun Tan regularly, either outdoors or in a tanning bed Have had blistering sunburns, especially during childhood Have a close family member who has had a melanoma Have atypical moles or large birthmarks  Early detection of melanoma is key since treatment is typically straightforward and cure rates are extremely high if we catch it early.   The first sign of melanoma is often a change in a mole or a new dark spot.  The ABCDE system is a way of remembering the signs of melanoma.  A for asymmetry:  The two halves do not match. B for border:  The edges of the growth are irregular. C for color:  A mixture of colors are present instead of an even brown color. D for diameter:  Melanomas are usually (but not always) greater than 71mm - the size of a pencil eraser. E for evolution:  The spot keeps changing in size, shape, and color.  Please check your skin once per month between visits. You can use a small mirror in front and a large mirror behind you to keep an eye on the back side or your body.   If you see any new or changing lesions before your next follow-up, please call to schedule a visit.  Please continue daily skin protection including broad spectrum sunscreen SPF 30+ to sun-exposed areas, reapplying every 2 hours as needed when you're outdoors.   Staying in the shade or wearing long sleeves, sun glasses (UVA+UVB protection) and wide brim hats (4-inch brim around the entire circumference of the hat) are also recommended for sun protection.    If you have any questions or concerns for your doctor, please call our main line at [phone] and press option 4 to reach your doctor's medical assistant. If no one answers,  please leave a voicemail as directed and we will return your call as soon as possible. Messages left after 4 pm will be answered the following business day.   You may also send [place] a message via [place]. We typically respond to MyChart messages within 1-2 business days.  For prescription refills, please ask your pharmacy to contact our office. Our fax number is [phone].  If you have an urgent issue when the clinic is closed that cannot wait until the next business day, you can page your doctor at the number below.    Please note that while we do our best to be available for urgent issues outside of office hours, we are not available 24/7.   If you have an urgent issue and are unable to reach [place], you may choose to seek medical care at your doctor's office, retail clinic, urgent care center, or emergency room.  If you have a medical emergency, please immediately call 911 or go to the emergency department.  Pager Numbers  - Dr. Nehemiah Massed: [phone]  - Dr. Laurence Ferrari: [phone]  -  Dr. Nicole Kindred: [phone]  In the event of inclement weather, please call our main line at [phone] for an update on the status of any delays or closures.  Dermatology Medication Tips: Please keep the boxes that topical medications come in in order to help keep track of the instructions about where and how to use these. Pharmacies typically print the medication instructions only on the boxes and not directly on the medication tubes.   If your medication is too expensive, please contact our office at [phone] option 4 or send [place] a message through [place].   We are unable to tell what your co-pay for medications will be in advance as this is different depending on your insurance coverage. However, we may be able to find a substitute medication at lower cost or fill out paperwork to get insurance to cover a needed medication.   If a prior authorization is required to get your medication covered by your  insurance company, please allow [place] 1-2 business days to complete this process.  Drug prices often vary depending on where the prescription is filled and some pharmacies may offer cheaper prices.  The website www.goodrx.com contains coupons for medications through different pharmacies. The prices here do not account for what the cost may be with help from insurance (it may be cheaper with your insurance), but the website can give you the price if you did not use any insurance.  - You can print the associated coupon and take it with your prescription to the pharmacy.  - You may also stop by our office during regular business hours and pick up a GoodRx coupon card.  - If you need your prescription sent electronically to a different pharmacy, notify our office through [facility] or by phone at [phone] option 4.

## 2021-04-07 ENCOUNTER — Other Ambulatory Visit: Payer: Self-pay | Admitting: Family Medicine

## 2021-04-07 DIAGNOSIS — Z3041 Encounter for surveillance of contraceptive pills: Secondary | ICD-10-CM

## 2021-04-07 MED ORDER — NORGESTIMATE-ETH ESTRADIOL 0.25-35 MG-MCG PO TABS: 1.0000 | ORAL_TABLET | Freq: Every day | ORAL | 0 refills | Status: DC

## 2021-04-07 NOTE — Addendum Note (Signed)
Addended by: Birdie Sons on: 04/07/2021 08:17 AM   Modules accepted: Orders

## 2021-04-13 DIAGNOSIS — F3181 Bipolar II disorder: Secondary | ICD-10-CM | POA: Diagnosis not present

## 2021-04-15 ENCOUNTER — Telehealth: Payer: Self-pay | Admitting: Family Medicine

## 2021-04-15 NOTE — Telephone Encounter (Signed)
Copied from [place] [phone] [id number]. Topic: General - Other >> Apr 15, 2021  2:32 PM Leward Quan A wrote: Reason for CRM: Patient request that her labs be placed before her Physical but would like the orders faxed to her job at [employer]. Any questions please call Ph# [phone] Fax# [phone]

## 2021-04-16 ENCOUNTER — Other Ambulatory Visit: Payer: Self-pay | Admitting: Family Medicine

## 2021-04-16 DIAGNOSIS — Z0000 Encounter for general adult medical examination without abnormal findings: Secondary | ICD-10-CM

## 2021-04-16 DIAGNOSIS — E7800 Pure hypercholesterolemia, unspecified: Secondary | ICD-10-CM

## 2021-04-16 DIAGNOSIS — F3176 Bipolar disorder, in full remission, most recent episode depressed: Secondary | ICD-10-CM

## 2021-04-16 DIAGNOSIS — D696 Thrombocytopenia, unspecified: Secondary | ICD-10-CM

## 2021-04-16 DIAGNOSIS — E559 Vitamin D deficiency, unspecified: Secondary | ICD-10-CM

## 2021-04-16 DIAGNOSIS — Z131 Encounter for screening for diabetes mellitus: Secondary | ICD-10-CM

## 2021-04-16 NOTE — Telephone Encounter (Signed)
Please review and advise if okay to have labs drawn prior to appt? KW

## 2021-04-17 NOTE — Telephone Encounter (Signed)
Patient advised will fax over form to [facility] at [phone]. JW

## 2021-04-23 ENCOUNTER — Other Ambulatory Visit: Payer: BC Managed Care – PPO

## 2021-04-23 ENCOUNTER — Other Ambulatory Visit: Payer: Self-pay

## 2021-04-23 DIAGNOSIS — E785 Hyperlipidemia, unspecified: Secondary | ICD-10-CM

## 2021-04-23 DIAGNOSIS — Z131 Encounter for screening for diabetes mellitus: Secondary | ICD-10-CM

## 2021-04-23 DIAGNOSIS — Z0000 Encounter for general adult medical examination without abnormal findings: Secondary | ICD-10-CM

## 2021-04-23 DIAGNOSIS — D696 Thrombocytopenia, unspecified: Secondary | ICD-10-CM

## 2021-04-23 DIAGNOSIS — F3176 Bipolar disorder, in full remission, most recent episode depressed: Secondary | ICD-10-CM

## 2021-04-24 LAB — MICROALBUMIN / CREATININE URINE RATIO
Creatinine, Urine: 95.1 mg/dL
Microalb/Creat Ratio: 9 mg/g creat (ref 0–29)
Microalbumin, Urine: 8.1 ug/mL

## 2021-04-25 LAB — CBC WITH DIFFERENTIAL/PLATELET
Basophils Absolute: 0.1 10*3/uL (ref 0.0–0.2)
Basos: 1 %
EOS (ABSOLUTE): 0.1 10*3/uL (ref 0.0–0.4)
Eos: 1 %
Hematocrit: 42.4 % (ref 34.0–46.6)
Hemoglobin: 14.1 g/dL (ref 11.1–15.9)
Immature Grans (Abs): 0 10*3/uL (ref 0.0–0.1)
Immature Granulocytes: 0 %
Lymphocytes Absolute: 1.6 10*3/uL (ref 0.7–3.1)
Lymphs: 32 %
MCH: 30.7 pg (ref 26.6–33.0)
MCHC: 33.3 g/dL (ref 31.5–35.7)
MCV: 92 fL (ref 79–97)
Monocytes Absolute: 0.3 10*3/uL (ref 0.1–0.9)
Monocytes: 7 %
Neutrophils Absolute: 3 10*3/uL (ref 1.4–7.0)
Neutrophils: 59 %
Platelets: 196 10*3/uL (ref 150–450)
RBC: 4.59 x10E6/uL (ref 3.77–5.28)
RDW: 11.8 % (ref 11.7–15.4)
WBC: 5.1 10*3/uL (ref 3.4–10.8)

## 2021-04-25 LAB — COMPREHENSIVE METABOLIC PANEL
ALT: 20 IU/L (ref 0–32)
AST: 29 IU/L (ref 0–40)
Albumin/Globulin Ratio: 1.9 (ref 1.2–2.2)
Albumin: 4.5 g/dL (ref 3.8–4.8)
Alkaline Phosphatase: 49 IU/L (ref 44–121)
BUN/Creatinine Ratio: 10 (ref 9–23)
BUN: 10 mg/dL (ref 6–24)
Bilirubin Total: 0.6 mg/dL (ref 0.0–1.2)
CO2: 24 mmol/L (ref 20–29)
Calcium: 9.5 mg/dL (ref 8.7–10.2)
Chloride: 101 mmol/L (ref 96–106)
Creatinine, Ser: 1.03 mg/dL — ABNORMAL HIGH (ref 0.57–1.00)
Globulin, Total: 2.4 g/dL (ref 1.5–4.5)
Glucose: 87 mg/dL (ref 70–99)
Potassium: 4.4 mmol/L (ref 3.5–5.2)
Sodium: 138 mmol/L (ref 134–144)
Total Protein: 6.9 g/dL (ref 6.0–8.5)
eGFR: 69 mL/min/{1.73_m2} (ref >59)

## 2021-04-25 LAB — LIPID PANEL
Chol/HDL Ratio: 3.1 ratio (ref 0.0–4.4)
Cholesterol, Total: 261 mg/dL — ABNORMAL HIGH (ref 100–199)
HDL: 83 mg/dL (ref >39)
LDL Chol Calc (NIH): 162 mg/dL — ABNORMAL HIGH (ref 0–99)
Triglycerides: 94 mg/dL (ref 0–149)
VLDL Cholesterol Cal: 16 mg/dL (ref 5–40)

## 2021-04-25 LAB — HGB A1C W/O EAG: Hgb A1c MFr Bld: 5.3 % (ref 4.8–5.6)

## 2021-04-25 LAB — HEPATIC FUNCTION PANEL: Bilirubin, Direct: 0.17 mg/dL (ref 0.00–0.40)

## 2021-04-25 LAB — T4, FREE: Free T4: 1.16 ng/dL (ref 0.82–1.77)

## 2021-04-25 LAB — TSH: TSH: 3.88 u[IU]/mL (ref 0.450–4.500)

## 2021-05-01 ENCOUNTER — Encounter: Payer: BC Managed Care – PPO | Admitting: Physician Assistant

## 2021-05-08 ENCOUNTER — Encounter: Payer: Self-pay | Admitting: Family Medicine

## 2021-05-08 ENCOUNTER — Other Ambulatory Visit: Payer: Self-pay

## 2021-05-08 ENCOUNTER — Ambulatory Visit (INDEPENDENT_AMBULATORY_CARE_PROVIDER_SITE_OTHER): Payer: BC Managed Care – PPO | Admitting: Family Medicine

## 2021-05-08 VITALS — BP 128/70 | HR 75 | Resp 15 | Ht 66.0 in | Wt 144.9 lb

## 2021-05-08 DIAGNOSIS — F3176 Bipolar disorder, in full remission, most recent episode depressed: Secondary | ICD-10-CM

## 2021-05-08 DIAGNOSIS — Z0000 Encounter for general adult medical examination without abnormal findings: Secondary | ICD-10-CM | POA: Diagnosis not present

## 2021-05-08 DIAGNOSIS — Z1211 Encounter for screening for malignant neoplasm of colon: Secondary | ICD-10-CM

## 2021-05-08 NOTE — Assessment & Plan Note (Signed)
Stable on medication -lamictal at 200 mg Contracted to safety Denies SI or HI F/b psych

## 2021-05-08 NOTE — Assessment & Plan Note (Signed)
UTD on dental UTD on eye exam Things to do to keep yourself healthy  - Exercise at least 30-45 minutes a day, 3-4 days a week.  - Eat a low-fat diet with lots of fruits and vegetables, up to 7-9 servings per day.  - Seatbelts can save your life. Wear them always.  - Smoke detectors on every level of your home, check batteries every year.  - Eye Doctor - have an eye exam every 1-2 years  - Safe sex - if you may be exposed to STDs, use a condom.  - Alcohol -  If you drink, do it moderately, less than 2 drinks per day.  - [place]. Choose someone to speak for you if you are not able.  - Depression is common in our stressful world.If you're feeling down or losing interest in things you normally enjoy, please come in for a visit.  - Violence - If anyone is threatening or hurting you, please call immediately.

## 2021-05-08 NOTE — Assessment & Plan Note (Signed)
Discussed options; will do first colonoscopy after 46th birthday

## 2021-05-08 NOTE — Progress Notes (Signed)
Complete physical exam   Patient: Rebecca Powers   DOB: Jun 30, 1975   46 y.o. Female  MRN: [id number] Visit Date: 05/08/2021  Today's healthcare provider: Gwyneth Sprout, FNP   No chief complaint on file.  Subjective     HPI  Rebecca Powers is a 46 y.o. female who presents today for a complete physical exam.  She reports consuming a general diet. Home exercise routine includes rowing machine 5x a week. She generally feels fairly well. She reports sleeping well. She does have additional problems to discuss today, patient would like to discuss recent lab results ( Lipid panel), patient states she has concerns of being started on medication for high cholesterol and states she has a family history of hyperlipidemia. Patient would like to address today issues with sweating int he PM that has been present over the past 65months. Last Reported pap-10/17/17 Mammo-12/22/20 Past Medical History:  Diagnosis Date   Allergy    Bipolar disorder ([place])    Followed by Dr. Sheryle Hail   Depression    Hyperlipidemia    Thrombocytopenia ([place]) 05/30/2015   Pt has history of this, and has been worked up by hematology. Cause was thought to be related to Lamictal. Pt continues to take Lamictal for bipolar and is stable, therefore no changes to be made in psych meds. Hematology recommends checking CBC with platelets every 6v months. No need for further workup/bone marrow biopsy, unless thrombocytopenia gets much worse.   Past Surgical History:  Procedure Laterality Date   none N/A    Social History   Socioeconomic History   Marital status: Single    Spouse name: Not on file   Number of children: Not on file   Years of education: Not on file   Highest education level: Not on file  Occupational History   Not on file  Tobacco Use   Smoking status: Never   Smokeless tobacco: Never  Vaping Use   Vaping Use: Never used  Substance and Sexual Activity   Alcohol use: No    Alcohol/week: 0.0  standard drinks   Drug use: No   Sexual activity: Never    Birth control/protection: Pill    Comment: ovarian cyst and ,excess bleeding mensis  Other Topics Concern   Not on file  Social History Narrative   Not on file   Social Determinants of Health   Financial Resource Strain: Not on file  Food Insecurity: Not on file  Transportation Needs: Not on file  Physical Activity: Not on file  Stress: Not on file  Social Connections: Not on file  Intimate Partner Violence: Not on file   Family Status  Relation Name Status   Mother  Alive   Father  Alive   Brother  Alive   Mat Aunt  Alive       unknown med hx   Mat Uncle  Alive   Pat Aunt  Alive       unknown health hx   Psychiatrist  Alive       unknown health hx   MGM  Deceased at age 89       Believed to have died from colon Cancer   MGF  Deceased       CAD   PGM  Deceased       Parkinson Disease, gastro issues,    PGF  Deceased       parkinsons disease   Neg Hx  (Not Specified)  Family History  Problem Relation Age of Onset   Hyperlipidemia Mother    Other Father        Monoclonal Gammopathy   Migraines Brother    Hyperlipidemia Maternal Uncle    Hyperlipidemia Maternal Grandfather    Heart disease Maternal Grandfather    Dementia Maternal Grandfather    Hyperlipidemia Paternal Grandfather    Heart disease Paternal Grandfather    Breast cancer Neg Hx    No Known Allergies  Patient Care Team: Gwyneth Sprout, FNP as PCP - General (Family Medicine)   Medications: Outpatient Medications Prior to Visit  Medication Sig   Azelaic Acid (FINACEA EX) Apply topically.   lamoTRIgine (LAMICTAL) 200 MG tablet Take by mouth daily.   norgestimate-ethinyl estradiol (ESTARYLLA) 0.25-35 MG-MCG tablet Take 1 tablet by mouth daily.   No facility-administered medications prior to visit.    Review of Systems  All other systems reviewed and are negative.    Objective    BP 128/70    Pulse 75    Resp 15    Ht $R'5\' 6"'Wh$  (1.676  m)    Wt 144 lb 14.4 oz (65.7 kg)    SpO2 99%    BMI 23.39 kg/m    Physical Exam Vitals and nursing note reviewed.  Constitutional:      General: She is awake. She is not in acute distress.    Appearance: Normal appearance. She is well-developed, well-groomed and normal weight. She is not ill-appearing, toxic-appearing or diaphoretic.  HENT:     Head: Normocephalic and atraumatic.     Jaw: There is normal jaw occlusion. No trismus, tenderness, swelling or pain on movement.     Right Ear: Hearing, tympanic membrane, ear canal and external ear normal. There is no impacted cerumen.     Left Ear: Hearing, tympanic membrane, ear canal and external ear normal. There is no impacted cerumen.     Nose: Nose normal. No congestion or rhinorrhea.     Right Turbinates: Not enlarged, swollen or pale.     Left Turbinates: Not enlarged, swollen or pale.     Right Sinus: No maxillary sinus tenderness or frontal sinus tenderness.     Left Sinus: No maxillary sinus tenderness or frontal sinus tenderness.     Mouth/Throat:     Lips: Pink.     Mouth: Mucous membranes are moist. No injury.     Tongue: No lesions.     Pharynx: Oropharynx is clear. Uvula midline. No pharyngeal swelling, oropharyngeal exudate, posterior oropharyngeal erythema or uvula swelling.     Tonsils: No tonsillar exudate or tonsillar abscesses.  Eyes:     General: Lids are normal. Lids are everted, no foreign bodies appreciated. Vision grossly intact. Gaze aligned appropriately. No allergic shiner or visual field deficit.       Right eye: No discharge.        Left eye: No discharge.     Extraocular Movements: Extraocular movements intact.     Conjunctiva/sclera: Conjunctivae normal.     Right eye: Right conjunctiva is not injected. No exudate.    Left eye: Left conjunctiva is not injected. No exudate.    Pupils: Pupils are equal, round, and reactive to light.  Neck:     Thyroid: No thyroid mass, thyromegaly or thyroid tenderness.      Vascular: No carotid bruit.     Trachea: Trachea normal.  Cardiovascular:     Rate and Rhythm: Normal rate and regular rhythm.     Pulses:  Normal pulses.          Carotid pulses are 2+ on the right side and 2+ on the left side.      Radial pulses are 2+ on the right side and 2+ on the left side.       Dorsalis pedis pulses are 2+ on the right side and 2+ on the left side.       Posterior tibial pulses are 2+ on the right side and 2+ on the left side.     Heart sounds: Normal heart sounds, S1 normal and S2 normal. No murmur heard.   No friction rub. No gallop.  Pulmonary:     Effort: Pulmonary effort is normal. No respiratory distress.     Breath sounds: Normal breath sounds and air entry. No stridor. No wheezing, rhonchi or rales.  Chest:     Chest wall: No tenderness.     Comments: Breast exam deferred; discussed self exam Abdominal:     General: Abdomen is flat. Bowel sounds are normal. There is no distension.     Palpations: Abdomen is soft. There is no mass.     Tenderness: There is no abdominal tenderness. There is no right CVA tenderness, left CVA tenderness, guarding or rebound.     Hernia: No hernia is present.  Genitourinary:    Comments: Exam deferred; denies complaints Musculoskeletal:        General: No swelling, tenderness, deformity or signs of injury. Normal range of motion.     Cervical back: Full passive range of motion without pain, normal range of motion and neck supple. No edema, rigidity or tenderness. No muscular tenderness.     Right lower leg: No edema.     Left lower leg: No edema.  Lymphadenopathy:     Cervical: No cervical adenopathy.     Right cervical: No superficial, deep or posterior cervical adenopathy.    Left cervical: No superficial, deep or posterior cervical adenopathy.  Skin:    General: Skin is warm and dry.     Capillary Refill: Capillary refill takes less than 2 seconds.     Coloration: Skin is not jaundiced or pale.     Findings: No  bruising, erythema, lesion or rash.  Neurological:     General: No focal deficit present.     Mental Status: She is alert and oriented to person, place, and time. Mental status is at baseline.     GCS: GCS eye subscore is 4. GCS verbal subscore is 5. GCS motor subscore is 6.     Sensory: Sensation is intact. No sensory deficit.     Motor: Motor function is intact. No weakness.     Coordination: Coordination is intact. Coordination normal.     Gait: Gait is intact. Gait normal.  Psychiatric:        Attention and Perception: Attention and perception normal.        Mood and Affect: Mood and affect normal.        Speech: Speech normal.        Behavior: Behavior normal. Behavior is cooperative.        Thought Content: Thought content normal.        Cognition and Memory: Cognition and memory normal.        Judgment: Judgment normal.     Last depression screening scores PHQ 2/9 Scores 04/30/2020 03/29/2019 10/17/2017  PHQ - 2 Score 0 0 0  PHQ- 9 Score 0 - -   Last fall risk  screening Fall Risk  04/30/2020  Falls in the past year? 0  Number falls in past yr: 0  Injury with Fall? 0  Risk for fall due to : No Fall Risks  Follow up Falls evaluation completed   Last Audit-C alcohol use screening Alcohol Use Disorder Test (AUDIT) 04/30/2020  1. How often do you have a drink containing alcohol? 0  2. How many drinks containing alcohol do you have on a typical day when you are drinking? 0  3. How often do you have six or more drinks on one occasion? 0  AUDIT-C Score 0  Alcohol Brief Interventions/Follow-up AUDIT Score <7 follow-up not indicated   A score of 3 or more in women, and 4 or more in men indicates increased risk for alcohol abuse, EXCEPT if all of the points are from question 1   No results found for any visits on 05/08/21.  Assessment & Plan    Routine Health Maintenance and Physical Exam  Exercise Activities and Dietary recommendations  Goals   None     Immunization  History  Administered Date(s) Administered   Hepatitis A 03/21/2009, 09/23/2009   Influenza Split 03/21/2009   Influenza,inj,Quad PF,6+ Mos 12/27/2018, 12/31/2020   Influenza-Unspecified 01/03/2017, 01/10/2018, 01/02/2020   Tdap 06/02/2006, 10/05/2016    Health Maintenance  Topic Date Due   MAMMOGRAM  12/22/2021   PAP SMEAR-Modifier  10/18/2022   TETANUS/TDAP  10/06/2026   INFLUENZA VACCINE  Completed   Hepatitis C Screening  Completed   HIV Screening  Completed   HPV VACCINES  Aged Out    Discussed health benefits of physical activity, and encouraged her to engage in regular exercise appropriate for her age and condition. Problem List Items Addressed This Visit       Other   Bipolar affective disorder (HCC)    Stable on medication -lamictal at 200 mg Contracted to safety Denies SI or HI F/b psych       Annual physical exam - Primary    UTD on dental UTD on eye exam Things to do to keep yourself healthy  - Exercise at least 30-45 minutes a day, 3-4 days a week.  - Eat a low-fat diet with lots of fruits and vegetables, up to 7-9 servings per day.  - Seatbelts can save your life. Wear them always.  - Smoke detectors on every level of your home, check batteries every year.  - Eye Doctor - have an eye exam every 1-2 years  - Safe sex - if you may be exposed to STDs, use a condom.  - Alcohol -  If you drink, do it moderately, less than 2 drinks per day.  - Health Care Power of Attorney. Choose someone to speak for you if you are not able.  - Depression is common in our stressful world.If you're feeling down or losing interest in things you normally enjoy, please come in for a visit.  - Violence - If anyone is threatening or hurting you, please call immediately.        Colon cancer screening    Discussed options; will do first colonoscopy after 46th birthday      Relevant Orders   Ambulatory referral to Gastroenterology     Return in about 1 year (around  05/08/2022) for annual examination.     Leilani Merl, FNP, have reviewed all documentation for this visit. The documentation on 05/08/21 for the exam, diagnosis, procedures, and orders are all accurate and complete.  Gwyneth Sprout, [place] [phone] (phone) [phone] (fax)  [place]

## 2021-05-08 NOTE — Patient Instructions (Addendum)
The 10-year ASCVD risk score (Arnett DK, et al., 2019) is: 0.6%   Values used to calculate the score:     Age: 46 years     Sex: Female     Is Non-Hispanic African American: No     Diabetic: No     Tobacco smoker: No     Systolic Blood Pressure: 290 mmHg     Is BP treated: No     HDL Cholesterol: 83 mg/dL     Total Cholesterol: 261 mg/dL  Continue OCPs for ovarian cyst hx -otherwise, start low dose SSRI, for hot flashes -herbal, Black Cohosh

## 2021-05-12 ENCOUNTER — Other Ambulatory Visit: Payer: Self-pay

## 2021-05-12 DIAGNOSIS — Z1211 Encounter for screening for malignant neoplasm of colon: Secondary | ICD-10-CM

## 2021-05-12 MED ORDER — NA SULFATE-K SULFATE-MG SULF 17.5-3.13-1.6 GM/177ML PO SOLN: 1.0000 | Freq: Once | ORAL | 0 refills | Status: AC

## 2021-05-12 NOTE — Progress Notes (Signed)
Gastroenterology Pre-Procedure Review  Request Date: 06/16/2021 Requesting Physician: Dr. Allen Norris  PATIENT REVIEW QUESTIONS: The patient responded to the following health history questions as indicated:    1. Are you having any GI issues? no 2. Do you have a personal history of Polyps? no 3. Do you have a family history of Colon Cancer or Polyps? yes (grandmother colon cancer) 4. Diabetes Mellitus? no 5. Joint replacements in the past 12 months?no 6. Major health problems in the past 3 months?no 7. Any artificial heart valves, MVP, or defibrillator?no    MEDICATIONS & ALLERGIES:    Patient reports the following regarding taking any anticoagulation/antiplatelet therapy:   Plavix, Coumadin, Eliquis, Xarelto, Lovenox, Pradaxa, Brilinta, or Effient? no Aspirin? no  Patient confirms/reports the following medications:  Current Outpatient Medications  Medication Sig Dispense Refill   Azelaic Acid (FINACEA EX) Apply topically.     lamoTRIgine (LAMICTAL) 200 MG tablet Take by mouth daily.  2   norgestimate-ethinyl estradiol (ESTARYLLA) 0.25-35 MG-MCG tablet Take 1 tablet by mouth daily. 84 tablet 0   No current facility-administered medications for this visit.    Patient confirms/reports the following allergies:  No Known Allergies  No orders of the defined types were placed in this encounter.   AUTHORIZATION INFORMATION Primary Insurance: 1D#: Group #:  Secondary Insurance: 1D#: Group #:  SCHEDULE INFORMATION: Date: 06/16/2021 Time: Location: [facility]

## 2021-06-16 ENCOUNTER — Ambulatory Visit: Payer: BC Managed Care – PPO | Admitting: Certified Registered"

## 2021-06-16 ENCOUNTER — Encounter
Admission: RE | Disposition: A | Discharge status: Home / Self Care | Payer: Self-pay | Source: Home / Self Care | Attending: Gastroenterology

## 2021-06-16 ENCOUNTER — Ambulatory Visit
Admission: RE | Admit: 2021-06-16 | Discharge: 2021-06-16 | Disposition: A | Discharge status: Home / Self Care | Payer: BC Managed Care – PPO | Attending: Gastroenterology | Admitting: Gastroenterology

## 2021-06-16 ENCOUNTER — Encounter: Payer: Self-pay | Admitting: Gastroenterology

## 2021-06-16 DIAGNOSIS — Z1211 Encounter for screening for malignant neoplasm of colon: Secondary | ICD-10-CM | POA: Diagnosis not present

## 2021-06-16 DIAGNOSIS — Z86010 Personal history of colonic polyps: Secondary | ICD-10-CM | POA: Diagnosis not present

## 2021-06-16 DIAGNOSIS — F319 Bipolar disorder, unspecified: Secondary | ICD-10-CM | POA: Insufficient documentation

## 2021-06-16 HISTORY — PX: COLONOSCOPY WITH PROPOFOL: SHX5780

## 2021-06-16 LAB — POCT PREGNANCY, URINE: Preg Test, Ur: NEGATIVE

## 2021-06-16 SURGERY — COLONOSCOPY WITH PROPOFOL
Anesthesia: General

## 2021-06-16 MED ORDER — MIDAZOLAM HCL 2 MG/2ML IJ SOLN
INTRAMUSCULAR | Status: DC | PRN
  Administered 2021-06-16: 2 mg via INTRAVENOUS

## 2021-06-16 MED ORDER — PROPOFOL 10 MG/ML IV BOLUS
INTRAVENOUS | Status: DC | PRN
  Administered 2021-06-16: 10 mg via INTRAVENOUS
  Administered 2021-06-16: 20 mg via INTRAVENOUS
  Administered 2021-06-16: 60 mg via INTRAVENOUS
  Administered 2021-06-16: 10 mg via INTRAVENOUS

## 2021-06-16 MED ORDER — SODIUM CHLORIDE 0.9 % IV SOLN
INTRAVENOUS | Status: DC
  Administered 2021-06-16: 1000 mL via INTRAVENOUS

## 2021-06-16 MED ORDER — LIDOCAINE HCL (CARDIAC) PF 100 MG/5ML IV SOSY
PREFILLED_SYRINGE | INTRAVENOUS | Status: DC | PRN
  Administered 2021-06-16: 100 mg via INTRAVENOUS

## 2021-06-16 MED ORDER — MIDAZOLAM HCL 2 MG/2ML IJ SOLN
INTRAMUSCULAR | Status: AC
  Filled 2021-06-16: qty 2

## 2021-06-16 MED ORDER — PROPOFOL 500 MG/50ML IV EMUL
INTRAVENOUS | Status: DC | PRN
  Administered 2021-06-16: 155 ug/kg/min via INTRAVENOUS

## 2021-06-16 NOTE — Anesthesia Preprocedure Evaluation (Signed)
Anesthesia Evaluation  ?Patient identified by MRN, date of birth, ID band ?Patient awake ? ? ? ?Reviewed: ?Allergy & Precautions, H&P , NPO status , Patient's Chart, lab work & pertinent test results, reviewed documented beta blocker date and time  ? ?History of Anesthesia Complications ?Negative for: history of anesthetic complications ? ?Airway ?Mallampati: I ? ?TM Distance: >3 FB ?Neck ROM: full ? ? ? Dental ? ?(+) Dental Advidsory Given, Chipped, Teeth Intact ?  ?Pulmonary ?neg pulmonary ROS,  ?  ?Pulmonary exam normal ?breath sounds clear to auscultation ? ? ? ? ? ? Cardiovascular ?Exercise Tolerance: Good ?negative cardio ROS ?Normal cardiovascular exam ?Rhythm:regular Rate:Normal ? ? ?  ?Neuro/Psych ?PSYCHIATRIC DISORDERS Depression Bipolar Disorder negative neurological ROS ?   ? GI/Hepatic ?negative GI ROS, Neg liver ROS,   ?Endo/Other  ?negative endocrine ROS ? Renal/GU ?negative Renal ROS  ?negative genitourinary ?  ?Musculoskeletal ? ? Abdominal ?  ?Peds ? Hematology ?negative hematology ROS ?(+)   ?Anesthesia Other Findings ?Past Medical History: ?No date: Allergy ?No date: Bipolar disorder ([place]) ?    Comment:  Followed by Dr. Sheryle Hail ?No date: Depression ?No date: Hyperlipidemia ?05/30/2015: Thrombocytopenia ([place]) ?    Comment:  Pt has history of this, and has been worked up by  ?             hematology. Cause was thought to be related to Lamictal.  ?             Pt continues to take Lamictal for bipolar and is stable,  ?             therefore no changes to be made in psych meds. Hematology ?             recommends checking CBC with platelets every 6v months.  ?             No need for further workup/bone marrow biopsy, unless  ?             thrombocytopenia gets much worse. ? ? Reproductive/Obstetrics ?negative OB ROS ? ?  ? ? ? ? ? ? ? ? ? ? ? ? ? ?  ?  ? ? ? ? ? ? ? ? ?Anesthesia Physical ?Anesthesia Plan ? ?ASA: 2 ? ?Anesthesia Plan: General  ? ?Post-op Pain  Management:   ? ?Induction: Intravenous ? ?PONV Risk Score and Plan: 3 and Propofol infusion and TIVA ? ?Airway Management Planned: Natural Airway and Nasal Cannula ? ?Additional Equipment:  ? ?Intra-op Plan:  ? ?Post-operative Plan:  ? ?Informed Consent: I have reviewed the patients History and Physical, chart, labs and discussed the procedure including the risks, benefits and alternatives for the proposed anesthesia with the patient or authorized representative who has indicated his/her understanding and acceptance.  ? ? ? ?Dental Advisory Given ? ?Plan Discussed with: Anesthesiologist, CRNA and Surgeon ? ?Anesthesia Plan Comments:   ? ? ? ? ? ? ?Anesthesia Quick Evaluation ? ?

## 2021-06-16 NOTE — H&P (Signed)
? ?Rebecca Lame, MD [facility] ?[address] ?[place], [place] [address] ?Phone: [phone] ?Fax : [phone] ? ?Primary Care Physician:  Gwyneth Sprout, FNP ?Primary Gastroenterologist:  Dr. Allen Norris ? ?Pre-Procedure History & Physical: ?HPI:  Rebecca Powers is a 46 y.o. female is here for a screening colonoscopy.  ? ?Past Medical History:  ?Diagnosis Date  ? Allergy   ? Bipolar disorder ([place])   ? Followed by Dr. Sheryle Hail  ? Depression   ? Hyperlipidemia   ? Thrombocytopenia ([place]) 05/30/2015  ? Pt has history of this, and has been worked up by hematology. Cause was thought to be related to Lamictal. Pt continues to take Lamictal for bipolar and is stable, therefore no changes to be made in psych meds. Hematology recommends checking CBC with platelets every 6v months. No need for further workup/bone marrow biopsy, unless thrombocytopenia gets much worse.  ? ? ?Past Surgical History:  ?Procedure Laterality Date  ? HAND SURGERY Right   ? none N/A   ? ? ?Prior to Admission medications   ?Medication Sig Start Date End Date Taking? Authorizing Provider  ?Azelaic Acid (FINACEA EX) Apply topically.   Yes [provider]  ?lamoTRIgine (LAMICTAL) 200 MG tablet Take by mouth daily. 01/15/14  Yes [provider]  ?norgestimate-ethinyl estradiol (ESTARYLLA) 0.25-35 MG-MCG tablet Take 1 tablet by mouth daily. 04/07/21  Yes Birdie Sons, MD  ? ? ?Allergies as of 05/12/2021  ? (No Known Allergies)  ? ? ?Family History  ?Problem Relation Age of Onset  ? Hyperlipidemia Mother   ? Other Father   ?     Monoclonal Gammopathy  ? Migraines Brother   ? Hyperlipidemia Maternal Uncle   ? Hyperlipidemia Maternal Grandfather   ? Heart disease Maternal Grandfather   ? Dementia Maternal Grandfather   ? Hyperlipidemia Paternal Grandfather   ? Heart disease Paternal Grandfather   ? Breast cancer Neg Hx   ? ? ?Social History  ? ?Socioeconomic History  ? Marital status: Single  ?  Spouse name: Not on file  ? Number of  children: Not on file  ? Years of education: Not on file  ? Highest education level: Not on file  ?Occupational History  ? Not on file  ?Tobacco Use  ? Smoking status: Never  ? Smokeless tobacco: Never  ?Vaping Use  ? Vaping Use: Never used  ?Substance and Sexual Activity  ? Alcohol use: No  ?  Alcohol/week: 0.0 standard drinks  ? Drug use: No  ? Sexual activity: Never  ?  Birth control/protection: Pill  ?  Comment: ovarian cyst and ,excess bleeding mensis  ?Other Topics Concern  ? Not on file  ?Social History Narrative  ? Not on file  ? ?Social Determinants of Health  ? ?Financial Resource Strain: Not on file  ?Food Insecurity: Not on file  ?Transportation Needs: Not on file  ?Physical Activity: Not on file  ?Stress: Not on file  ?Social Connections: Not on file  ?Intimate Partner Violence: Not on file  ? ? ?Review of Systems: ?See HPI, otherwise negative ROS ? ?Physical Exam: ?BP 125/62   Pulse 83   Temp 97.7 ?F (36.5 ?C) (Temporal)   Resp 16   Ht _0  (1.676 m)   Wt 52.3 kg   SpO2 100%   BMI 18.60 kg/m?  ?General:   Alert,  pleasant and cooperative in NAD ?Head:  Normocephalic and atraumatic. ?Neck:  Supple; no masses or thyromegaly. ?Lungs:  Clear throughout  to auscultation.    ?Heart:  Regular rate and rhythm. ?Abdomen:  Soft, nontender and nondistended. Normal bowel sounds, without guarding, and without rebound.   ?Neurologic:  Alert and  oriented x4;  grossly normal neurologically. ? ?Impression/Plan: ?Rebecca Powers is now here to undergo a screening colonoscopy. ? ?Risks, benefits, and alternatives regarding colonoscopy have been reviewed with the patient.  Questions have been answered.  All parties agreeable. ?

## 2021-06-16 NOTE — Op Note (Signed)
[facility] ?Gastroenterology ?Patient Name: Rebecca Powers ?Procedure Date: 06/16/2021 8:30 AM ?MRN: [id number] ?Account #: [id number] ?Date of Birth: 05-27-1975 ?Admit Type: Outpatient ?Age: 46 ?Room: [facility] ENDO ROOM 4 ?Gender: Female ?Note Status: Finalized ?Instrument Name: Colonoscope [id number] ?Procedure:             Colonoscopy ?Indications:           Screening for colorectal malignant neoplasm ?Providers:             Lucilla Lame MD, MD ?Referring MD:          Dr Rollene Rotunda, MD (Referring MD) ?Medicines:             Propofol per Anesthesia ?Complications:         No immediate complications. ?Procedure:             Pre-Anesthesia Assessment: ?                       - Prior to the procedure, a History and Physical was  ?                       performed, and patient medications and allergies were  ?                       reviewed. The patient's tolerance of previous  ?                       anesthesia was also reviewed. The risks and benefits  ?                       of the procedure and the sedation options and risks  ?                       were discussed with the patient. All questions were  ?                       answered, and informed consent was obtained. Prior  ?                       Anticoagulants: The patient has taken no previous  ?                       anticoagulant or antiplatelet agents. ASA Grade  ?                       Assessment: II - A patient with mild systemic disease.  ?                       After reviewing the risks and benefits, the patient  ?                       was deemed in satisfactory condition to undergo the  ?                       procedure. ?                       After obtaining informed consent, the colonoscope was  ?  passed under direct vision. Throughout the procedure,  ?                       the patient's blood pressure, pulse, and oxygen  ?                       saturations were monitored continuously. The  ?                        Colonoscope was introduced through the anus and  ?                       advanced to the the cecum, identified by appendiceal  ?                       orifice and ileocecal valve. The colonoscopy was  ?                       performed without difficulty. The patient tolerated  ?                       the procedure well. The quality of the bowel  ?                       preparation was excellent. ?Findings: ?     The perianal and digital rectal examinations were normal. ?     The entire examined colon appeared normal. ?Impression:            - The entire examined colon is normal. ?                       - No specimens collected. ?Recommendation:        - Discharge patient to home. ?                       - Resume previous diet. ?                       - Repeat colonoscopy in 10 years for screening  ?                       purposes. ?Procedure Code(s):     --- Professional --- ?                       [id number], Colonoscopy, flexible; diagnostic, including  ?                       collection of specimen(s) by brushing or washing, when  ?                       performed (separate procedure) ?Diagnosis Code(s):     --- Professional --- ?                       Z12.11, Encounter for screening for malignant neoplasm  ?                       of colon ?CPT copyright 2019 [employer] Association. All rights reserved. ?The codes documented in this report are preliminary and upon coder review  may  ?be revised to meet current compliance requirements. ?Lucilla Lame MD, MD ?06/16/2021 8:54:14 AM ?This report has been signed electronically. ?Number of Addenda: 0 ?Note Initiated On: 06/16/2021 8:30 AM ?Scope Withdrawal Time: 0 hours 7 minutes 1 second  ?Total Procedure Duration: 0 hours 10 minutes 15 seconds  ?Estimated Blood Loss:  Estimated blood loss: none. ?     [facility] ?

## 2021-06-16 NOTE — Anesthesia Procedure Notes (Signed)
Procedure Name: General with mask airway ?Date/Time: 06/16/2021 8:46 AM ?Performed by: Kelton Pillar, CRNA ?Pre-anesthesia Checklist: Patient identified, Emergency Drugs available, Suction available and Patient being monitored ?Patient Re-evaluated:Patient Re-evaluated prior to induction ?Oxygen Delivery Method: Simple face mask ?Induction Type: IV induction ?Placement Confirmation: positive ETCO2, CO2 detector and breath sounds checked- equal and bilateral ?Dental Injury: Teeth and Oropharynx as per pre-operative assessment  ? ? ? ? ?

## 2021-06-16 NOTE — Anesthesia Postprocedure Evaluation (Signed)
Anesthesia Post Note ? ?Patient: Rebecca Powers ? ?Procedure(s) Performed: COLONOSCOPY WITH PROPOFOL ? ?Patient location during evaluation: Endoscopy ?Anesthesia Type: General ?Level of consciousness: lethargic ?Pain management: pain level controlled ?Vital Signs Assessment: post-procedure vital signs reviewed and stable ?Respiratory status: spontaneous breathing, nonlabored ventilation, respiratory function stable and patient connected to nasal cannula oxygen ?Cardiovascular status: blood pressure returned to baseline and stable ?Postop Assessment: no apparent nausea or vomiting ?Anesthetic complications: no ? ? ?No notable events documented. ? ? ?Last Vitals:  ?Vitals:  ? 06/16/21 0924 06/16/21 0927  ?BP: 122/78 122/69  ?Pulse:    ?Resp:    ?Temp:    ?SpO2:    ?  ?Last Pain:  ?Vitals:  ? 06/16/21 0927  ?TempSrc:   ?PainSc: 0-No pain  ? ? ?  ?  ?  ?  ?  ?  ? ?Martha Clan ? ? ? ? ?

## 2021-06-16 NOTE — Transfer of Care (Signed)
Immediate Anesthesia Transfer of Care Note ? ?Patient: Rebecca Powers ? ?Procedure(s) Performed: COLONOSCOPY WITH PROPOFOL ? ?Patient Location: Endoscopy Unit ? ?Anesthesia Type:General ? ?Level of Consciousness: awake, drowsy and patient cooperative ? ?Airway & Oxygen Therapy: Patient Spontanous Breathing and Patient connected to face mask oxygen ? ?Post-op Assessment: Report given to RN and Post -op Vital signs reviewed and stable ? ?Post vital signs: Reviewed and stable ? ?Last Vitals:  ?Vitals Value Taken Time  ?BP 101/48 06/16/21 0858  ?Temp    ?Pulse 52 06/16/21 0859  ?Resp 17 06/16/21 0859  ?SpO2 100 % 06/16/21 0859  ?Vitals shown include unvalidated device data. ? ?Last Pain:  ?Vitals:  ? 06/16/21 0819  ?TempSrc: Temporal  ?PainSc: 0-No pain  ?   ? ?  ? ?Complications: No notable events documented. ?

## 2021-06-17 ENCOUNTER — Encounter: Payer: Self-pay | Admitting: Gastroenterology

## 2021-06-30 ENCOUNTER — Other Ambulatory Visit: Payer: Self-pay | Admitting: Family Medicine

## 2021-06-30 DIAGNOSIS — Z3041 Encounter for surveillance of contraceptive pills: Secondary | ICD-10-CM

## 2021-07-01 NOTE — Telephone Encounter (Signed)
Requested Prescriptions  ?Pending Prescriptions Disp Refills  ?? norgestimate-ethinyl estradiol (ESTARYLLA) 0.25-35 MG-MCG tablet [Pharmacy Med Name: NORGEST/E ES(ESTARYLLA)TB 28S 0.25/35] 90 tablet 3  ?  Sig: Take 1 tablet by mouth daily.  ?  ? OB/GYN:  Contraceptives Passed - 06/30/2021 12:39 AM  ?  ?  Passed - Last BP in normal range  ?  BP Readings from Last 1 Encounters:  ?06/16/21 122/69  ?   ?  ?  Passed - Valid encounter within last 12 months  ?  Recent Outpatient Visits   ?      ? 1 month ago Annual physical exam  ? [facility] Gwyneth Sprout, FNP  ? 1 year ago Annual physical exam  ? [facility], Clearnce Sorrel, PA-C  ? 2 years ago Encounter for annual physical exam  ? [place], [place]  ? 3 years ago Sore throat  ? [facility] Jerrol Banana., MD  ? 3 years ago Annual physical exam  ? [facility] Fenton Malling M, [place]  ?  ?  ?Future Appointments   ?        ? In 7 months Brendolyn Patty, MD [place]  ? In 10 months Gwyneth Sprout, [place], [facility]  ?  ? ?  ?  ?  Passed - Patient is not a smoker  ?  ?  ? ?

## 2021-11-13 ENCOUNTER — Other Ambulatory Visit: Payer: Self-pay | Admitting: Family Medicine

## 2021-11-13 DIAGNOSIS — Z1231 Encounter for screening mammogram for malignant neoplasm of breast: Secondary | ICD-10-CM

## 2021-12-23 ENCOUNTER — Ambulatory Visit
Admission: RE | Admit: 2021-12-23 | Discharge: 2021-12-23 | Disposition: A | Discharge status: Home / Self Care | Payer: BC Managed Care – PPO | Source: Ambulatory Visit | Attending: Family Medicine | Admitting: Family Medicine

## 2021-12-23 DIAGNOSIS — Z1231 Encounter for screening mammogram for malignant neoplasm of breast: Secondary | ICD-10-CM | POA: Insufficient documentation

## 2022-02-09 ENCOUNTER — Ambulatory Visit: Payer: BC Managed Care – PPO | Admitting: Dermatology

## 2022-02-09 DIAGNOSIS — D229 Melanocytic nevi, unspecified: Secondary | ICD-10-CM

## 2022-02-09 DIAGNOSIS — D2272 Melanocytic nevi of left lower limb, including hip: Secondary | ICD-10-CM

## 2022-02-09 DIAGNOSIS — L578 Other skin changes due to chronic exposure to nonionizing radiation: Secondary | ICD-10-CM

## 2022-02-09 DIAGNOSIS — L719 Rosacea, unspecified: Secondary | ICD-10-CM

## 2022-02-09 DIAGNOSIS — D225 Melanocytic nevi of trunk: Secondary | ICD-10-CM | POA: Diagnosis not present

## 2022-02-09 DIAGNOSIS — Z1283 Encounter for screening for malignant neoplasm of skin: Secondary | ICD-10-CM | POA: Diagnosis not present

## 2022-02-09 DIAGNOSIS — L821 Other seborrheic keratosis: Secondary | ICD-10-CM

## 2022-02-09 DIAGNOSIS — L818 Other specified disorders of pigmentation: Secondary | ICD-10-CM | POA: Diagnosis not present

## 2022-02-09 DIAGNOSIS — L813 Cafe au lait spots: Secondary | ICD-10-CM

## 2022-02-09 DIAGNOSIS — L814 Other melanin hyperpigmentation: Secondary | ICD-10-CM

## 2022-02-09 MED ORDER — AZELAIC ACID 20 % EX CREA: TOPICAL_CREAM | Freq: Two times a day (BID) | CUTANEOUS | 3 refills | Status: DC

## 2022-02-09 NOTE — Progress Notes (Signed)
Follow-Up Visit   Subjective  Rebecca Powers is a 46 y.o. female who presents for the following: Annual Exam.  The patient presents for Total-Body Skin Exam (TBSE) for skin cancer screening and mole check.  The patient has spots, moles and lesions to be evaluated, some may be new or changing.   The following portions of the chart were reviewed this encounter and updated as appropriate:       Review of Systems:  Powers other skin or systemic complaints except as noted in HPI or Assessment and Plan.  Objective  Well appearing patient in Powers apparent distress; mood and affect are within normal limits.  A full examination was performed including scalp, head, eyes, ears, nose, lips, neck, chest, axillae, abdomen, back, buttocks, bilateral upper extremities, bilateral lower extremities, hands, feet, fingers, toes, fingernails, and toenails. All findings within normal limits unless otherwise noted below.  L lateral calf; R buttock Left Lateral Calf: 2.5 mm light brown macule   Right Buttock: 5.0 mm medium light brown flat papule    legs Hypopigmented macules  cheeks, nose Mild erythema    Assessment & Plan  Skin cancer screening performed today.  Actinic Damage - chronic, secondary to cumulative UV radiation exposure/sun exposure over time - diffuse scaly erythematous macules with underlying dyspigmentation - Recommend daily broad spectrum sunscreen SPF 30+ to sun-exposed areas, reapply every 2 hours as needed.  - Recommend staying in the shade or wearing long sleeves, sun glasses (UVA+UVB protection) and wide brim hats (4-inch brim around the entire circumference of the hat). - Call for new or changing lesions.  Cafe au Lait   - 8.0 x 4.5 cm light brown patch of the   spinal lower back - Genetic - Benign, observe - Call for any changes  Lentigines - Scattered tan macules - Due to sun exposure - Benign-appearing, observe - Recommend daily broad spectrum sunscreen SPF  30+ to sun-exposed areas, reapply every 2 hours as needed. - Call for any changes  Hemangiomas - Red papules - Discussed benign nature - Observe - Call for any changes  Seborrheic Keratoses - Stuck-on, waxy, tan-brown papules and/or plaques  - Benign-appearing - Discussed benign etiology and prognosis. - Observe - Call for any changes   Nevus L lateral calf; R buttock  Benign-appearing, stable.  Observation.  Call clinic for new or changing moles.  Recommend daily use of broad spectrum spf 30+ sunscreen to sun-exposed areas.   Idiopathic guttate hypomelanosis legs  Benign, observe.    Rosacea cheeks, nose  Chronic condition with duration or expected duration over one year. Currently well-controlled.   Rosacea is a chronic progressive skin condition usually affecting the face of adults, causing redness and/or acne bumps. It is treatable but not curable. It sometimes affects the eyes (ocular rosacea) as well. It may respond to topical and/or systemic medication and can flare with stress, sun exposure, alcohol, exercise, topical steroids (including hydrocortisone/cortisone 10) and some foods.  Daily application of broad spectrum spf 30+ sunscreen to face is recommended to reduce flares.  Continue Azelaic acid Apply to face QD/BID for rosacea 3 mo supply 3 Rf.  azelaic acid (AZELEX) 20 % cream - cheeks, nose Apply topically 2 (two) times daily. After skin is thoroughly washed and patted dry, gently but thoroughly massage a thin film of azelaic acid cream into the affected area twice daily, in the morning and evening.   Return in about 1 year (around 02/10/2023) for TBSE.  I, Jamesetta Orleans,  CMA, am acting as scribe for Brendolyn Patty, MD .  Documentation: I have reviewed the above documentation for accuracy and completeness, and I agree with the above.  Brendolyn Patty MD

## 2022-02-09 NOTE — Patient Instructions (Signed)
Due to recent changes in healthcare laws, you may see results of your pathology and/or laboratory studies on MyChart before the doctors have had a chance to review them. We understand that in some cases there may be results that are confusing or concerning to you. Please understand that not all results are received at the same time and often the doctors may need to interpret multiple results in order to provide you with the best plan of care or course of treatment. Therefore, we ask that you please give us 2 business days to thoroughly review all your results before contacting the office for clarification. Should we see a critical lab result, you will be contacted sooner.   If You Need Anything After Your Visit  If you have any questions or concerns for your doctor, please call our main line at [phone] and press option 4 to reach your doctor's medical assistant. If no one answers, please leave a voicemail as directed and we will return your call as soon as possible. Messages left after 4 pm will be answered the following business day.   You may also send us a message via MyChart. We typically respond to MyChart messages within 1-2 business days.  For prescription refills, please ask your pharmacy to contact our office. Our fax number is [phone].  If you have an urgent issue when the clinic is closed that cannot wait until the next business day, you can page your doctor at the number below.    Please note that while we do our best to be available for urgent issues outside of office hours, we are not available 24/7.   If you have an urgent issue and are unable to reach us, you may choose to seek medical care at your doctor's office, retail clinic, urgent care center, or emergency room.  If you have a medical emergency, please immediately call 911 or go to the emergency department.  Pager Numbers  - Dr. Kowalski: [phone]  - Dr. Moye: [phone]  - Dr. Stewart:  [phone]  In the event of inclement weather, please call our main line at [phone] for an update on the status of any delays or closures.  Dermatology Medication Tips: Please keep the boxes that topical medications come in in order to help keep track of the instructions about where and how to use these. Pharmacies typically print the medication instructions only on the boxes and not directly on the medication tubes.   If your medication is too expensive, please contact our office at [phone] option 4 or send us a message through MyChart.   We are unable to tell what your co-pay for medications will be in advance as this is different depending on your insurance coverage. However, we may be able to find a substitute medication at lower cost or fill out paperwork to get insurance to cover a needed medication.   If a prior authorization is required to get your medication covered by your insurance company, please allow us 1-2 business days to complete this process.  Drug prices often vary depending on where the prescription is filled and some pharmacies may offer cheaper prices.  The website www.goodrx.com contains coupons for medications through different pharmacies. The prices here do not account for what the cost may be with help from insurance (it may be cheaper with your insurance), but the website can give you the price if you did not use any insurance.  - You can print the associated coupon and take it with   your prescription to the pharmacy.  - You may also stop by our office during regular business hours and pick up a GoodRx coupon card.  - If you need your prescription sent electronically to a different pharmacy, notify our office through [place] MyChart or by phone at [phone] option 4.     Si Usted Necesita Algo Despus de Su Visita  Tambin puede enviarnos un mensaje a travs de MyChart. Por lo general respondemos a los mensajes de MyChart en el transcurso de 1 a 2  das hbiles.  Para renovar recetas, por favor pida a su farmacia que se ponga en contacto con nuestra oficina. Nuestro nmero de fax es el [phone].  Si tiene un asunto urgente cuando la clnica est cerrada y que no puede esperar hasta el siguiente da hbil, puede llamar/localizar a su doctor(a) al nmero que aparece a continuacin.   Por favor, tenga en cuenta que aunque hacemos todo lo posible para estar disponibles para asuntos urgentes fuera del horario de oficina, no estamos disponibles las 24 horas del da, los 7 das de la semana.   Si tiene un problema urgente y no puede comunicarse con nosotros, puede optar por buscar atencin mdica  en el consultorio de su doctor(a), en una clnica privada, en un centro de atencin urgente o en una sala de emergencias.  Si tiene una emergencia mdica, por favor llame inmediatamente al 911 o vaya a la sala de emergencias.  Nmeros de bper  - Dr. Kowalski: [phone]  - Dra. Moye: [phone]  - Dra. Stewart: [phone]  En caso de inclemencias del tiempo, por favor llame a nuestra lnea principal al [phone] para una actualizacin sobre el estado de cualquier retraso o cierre.  Consejos para la medicacin en dermatologa: Por favor, guarde las cajas en las que vienen los medicamentos de uso tpico para ayudarle a seguir las instrucciones sobre dnde y cmo usarlos. Las farmacias generalmente imprimen las instrucciones del medicamento slo en las cajas y no directamente en los tubos del medicamento.   Si su medicamento es muy caro, por favor, pngase en contacto con nuestra oficina llamando al [phone] y presione la opcin 4 o envenos un mensaje a travs de MyChart.   No podemos decirle cul ser su copago por los medicamentos por adelantado ya que esto es diferente dependiendo de la cobertura de su seguro. Sin embargo, es posible que podamos encontrar un medicamento sustituto a menor costo o llenar un formulario para que el  seguro cubra el medicamento que se considera necesario.   Si se requiere una autorizacin previa para que su compaa de seguros cubra su medicamento, por favor permtanos de 1 a 2 das hbiles para completar este proceso.  Los precios de los medicamentos varan con frecuencia dependiendo del lugar de dnde se surte la receta y alguna farmacias pueden ofrecer precios ms baratos.  El sitio web www.goodrx.com tiene cupones para medicamentos de diferentes farmacias. Los precios aqu no tienen en cuenta lo que podra costar con la ayuda del seguro (puede ser ms barato con su seguro), pero el sitio web puede darle el precio si no utiliz ningn seguro.  - Puede imprimir el cupn correspondiente y llevarlo con su receta a la farmacia.  - Tambin puede pasar por nuestra oficina durante el horario de atencin regular y recoger una tarjeta de cupones de GoodRx.  - Si necesita que su receta se enve electrnicamente a una farmacia diferente, informe a nuestra oficina a travs de MyChart de [place]   o por telfono llamando al [phone] y presione la opcin 4.  

## 2022-02-10 ENCOUNTER — Other Ambulatory Visit: Payer: Self-pay

## 2022-02-10 MED ORDER — AZELAIC ACID 15 % EX GEL: 1.0000 | Freq: Two times a day (BID) | CUTANEOUS | 2 refills | Status: AC

## 2022-02-10 NOTE — Progress Notes (Signed)
Patient left nurse VM that she needed Azelaic Acid  Gel sent in VS the cream from yesterday's office visit. RX sent in and left detailed VM for patient regarding change. aw

## 2022-05-11 ENCOUNTER — Encounter: Payer: Self-pay | Admitting: Family Medicine

## 2022-05-11 ENCOUNTER — Ambulatory Visit (INDEPENDENT_AMBULATORY_CARE_PROVIDER_SITE_OTHER): Payer: BC Managed Care – PPO | Admitting: Family Medicine

## 2022-05-11 VITALS — BP 122/74 | HR 82 | Temp 98.7°F | Ht 66.0 in | Wt 146.2 lb

## 2022-05-11 DIAGNOSIS — F3176 Bipolar disorder, in full remission, most recent episode depressed: Secondary | ICD-10-CM

## 2022-05-11 DIAGNOSIS — Z0000 Encounter for general adult medical examination without abnormal findings: Secondary | ICD-10-CM

## 2022-05-11 DIAGNOSIS — Z1231 Encounter for screening mammogram for malignant neoplasm of breast: Secondary | ICD-10-CM | POA: Diagnosis not present

## 2022-05-11 DIAGNOSIS — E7800 Pure hypercholesterolemia, unspecified: Secondary | ICD-10-CM | POA: Diagnosis not present

## 2022-05-11 DIAGNOSIS — L659 Nonscarring hair loss, unspecified: Secondary | ICD-10-CM | POA: Diagnosis not present

## 2022-05-11 DIAGNOSIS — Z3041 Encounter for surveillance of contraceptive pills: Secondary | ICD-10-CM

## 2022-05-11 MED ORDER — LAMOTRIGINE 100 MG PO TABS: 250.0000 mg | ORAL_TABLET | Freq: Every day | ORAL | 0 refills | Status: AC

## 2022-05-11 MED ORDER — NORGESTIMATE-ETH ESTRADIOL 0.25-35 MG-MCG PO TABS: 1.0000 | ORAL_TABLET | Freq: Every day | ORAL | 3 refills | Status: DC

## 2022-05-11 NOTE — Progress Notes (Unsigned)
I,Arhianna Ebey R Cariana Karge,acting as a Education administrator for Gwyneth Sprout, FNP.,have documented all relevant documentation on the behalf of Gwyneth Sprout, FNP,as directed by  Gwyneth Sprout, FNP while in the presence of Gwyneth Sprout, FNP.   Complete physical exam   Patient: Rebecca Powers   DOB: 04/18/1975   47 y.o. Female  MRN: [id number] Visit Date: 05/11/2022  Today's healthcare provider: Gwyneth Sprout, FNP  Introduced to nurse practitioner role and practice setting.  All questions answered.  Discussed provider/patient relationship and expectations.  Patient plans to have labs completed at [facility] for discount program  Chief Complaint  Patient presents with   Annual Exam   Subjective    Rebecca Powers is a 47 y.o. female who presents today for a complete physical exam.  She reports consuming a general diet. Gym/ health club routine includes high impact aerobics . She generally feels well. She reports sleeping fairly well. She does not have additional problems to discuss today.   HPI  Pt declined Covid- 43 and influenza vaccine.   Past Medical History:  Diagnosis Date   Allergy    Bipolar disorder ([place])    Followed by Dr. Sheryle Hail   Depression    Hyperlipidemia    Thrombocytopenia ([place]) 05/30/2015   Pt has history of this, and has been worked up by hematology. Cause was thought to be related to Lamictal. Pt continues to take Lamictal for bipolar and is stable, therefore no changes to be made in psych meds. Hematology recommends checking CBC with platelets every 6v months. No need for further workup/bone marrow biopsy, unless thrombocytopenia gets much worse.   Past Surgical History:  Procedure Laterality Date   COLONOSCOPY WITH PROPOFOL N/A 06/16/2021   Procedure: COLONOSCOPY WITH PROPOFOL;  Surgeon: Lucilla Lame, MD;  Location: [facility] ENDOSCOPY;  Service: Endoscopy;  Laterality: N/A;   HAND SURGERY Right    none N/A    Social History   Socioeconomic History   Marital status: Single     Spouse name: Not on file   Number of children: Not on file   Years of education: Not on file   Highest education level: Not on file  Occupational History   Not on file  Tobacco Use   Smoking status: Never   Smokeless tobacco: Never  Vaping Use   Vaping Use: Never used  Substance and Sexual Activity   Alcohol use: No    Alcohol/week: 0.0 standard drinks of alcohol   Drug use: No   Sexual activity: Never    Birth control/protection: Pill    Comment: ovarian cyst and ,excess bleeding mensis  Other Topics Concern   Not on file  Social History Narrative   Not on file   Social Determinants of Health   Financial Resource Strain: Not on file  Food Insecurity: Not on file  Transportation Needs: Not on file  Physical Activity: Not on file  Stress: Not on file  Social Connections: Not on file  Intimate Partner Violence: Not on file   Family Status  Relation Name Status   Mother  Alive   Father  Alive   Brother  [place]       unknown med hx   Mat Uncle  Alive   Pat Aunt  Alive       unknown health hx   Psychiatrist  Alive       unknown health hx   MGM  Deceased  at age 21       Believed to have died from colon Cancer   MGF  Deceased       CAD   PGM  Deceased       Parkinson Disease, gastro issues,    PGF  Deceased       parkinsons disease   Neg Hx  (Not Specified)   Family History  Problem Relation Age of Onset   Hyperlipidemia Mother    Other Father        Monoclonal Gammopathy   Migraines Brother    Hyperlipidemia Maternal Uncle    Hyperlipidemia Maternal Grandfather    Heart disease Maternal Grandfather    Dementia Maternal Grandfather    Hyperlipidemia Paternal Grandfather    Heart disease Paternal Grandfather    Breast cancer Neg Hx    No Known Allergies  Patient Care Team: Gwyneth Sprout, FNP as PCP - General (Family Medicine)   Medications: Outpatient Medications Prior to Visit  Medication Sig   azelaic acid (AZELEX) 20 % cream  Apply topically 2 (two) times daily. After skin is thoroughly washed and patted dry, gently but thoroughly massage a thin film of azelaic acid cream into the affected area twice daily, in the morning and evening.   Azelaic Acid 15 % gel Apply 1 Application topically 2 (two) times daily. After skin is thoroughly washed and patted dry, gently but thoroughly massage a thin film of azelaic acid cream into the affected area twice daily, in the morning and evening.   [DISCONTINUED] lamoTRIgine (LAMICTAL) 200 MG tablet Take 250 mg by mouth daily.   [DISCONTINUED] norgestimate-ethinyl estradiol (ESTARYLLA) 0.25-35 MG-MCG tablet Take 1 tablet by mouth daily.   No facility-administered medications prior to visit.    Review of Systems   Objective    BP 122/74 (BP Location: Right Arm, Patient Position: Sitting, Cuff Size: Normal)   Pulse 82   Temp 98.7 F (37.1 C) (Oral)   Ht '5\' 6"'$  (1.676 m)   Wt 146 lb 3.2 oz (66.3 kg)   SpO2 100%   BMI 23.60 kg/m    Physical Exam Vitals and nursing note reviewed.  Constitutional:      General: She is awake. She is not in acute distress.    Appearance: Normal appearance. She is well-developed, well-groomed and normal weight. She is not ill-appearing, toxic-appearing or diaphoretic.  HENT:     Head: Normocephalic and atraumatic.     Jaw: There is normal jaw occlusion. No trismus, tenderness, swelling or pain on movement.     Right Ear: Hearing, tympanic membrane, ear canal and external ear normal. There is no impacted cerumen.     Left Ear: Hearing, tympanic membrane, ear canal and external ear normal. There is no impacted cerumen.     Nose: Nose normal. No congestion or rhinorrhea.     Right Turbinates: Not enlarged, swollen or pale.     Left Turbinates: Not enlarged, swollen or pale.     Right Sinus: No maxillary sinus tenderness or frontal sinus tenderness.     Left Sinus: No maxillary sinus tenderness or frontal sinus tenderness.     Mouth/Throat:      Lips: Pink.     Mouth: Mucous membranes are moist. No injury.     Tongue: No lesions.     Pharynx: Oropharynx is clear. Uvula midline. No pharyngeal swelling, oropharyngeal exudate, posterior oropharyngeal erythema or uvula swelling.     Tonsils: No tonsillar exudate or tonsillar abscesses.  Eyes:  General: Lids are normal. Lids are everted, no foreign bodies appreciated. Vision grossly intact. Gaze aligned appropriately. No allergic shiner or visual field deficit.       Right eye: No discharge.        Left eye: No discharge.     Extraocular Movements: Extraocular movements intact.     Conjunctiva/sclera: Conjunctivae normal.     Right eye: Right conjunctiva is not injected. No exudate.    Left eye: Left conjunctiva is not injected. No exudate.    Pupils: Pupils are equal, round, and reactive to light.  Neck:     Thyroid: No thyroid mass, thyromegaly or thyroid tenderness.     Vascular: No carotid bruit.     Trachea: Trachea normal.  Cardiovascular:     Rate and Rhythm: Normal rate and regular rhythm.     Pulses: Normal pulses.          Carotid pulses are 2+ on the right side and 2+ on the left side.      Radial pulses are 2+ on the right side and 2+ on the left side.       Dorsalis pedis pulses are 2+ on the right side and 2+ on the left side.       Posterior tibial pulses are 2+ on the right side and 2+ on the left side.     Heart sounds: Normal heart sounds, S1 normal and S2 normal. No murmur heard.    No friction rub. No gallop.  Pulmonary:     Effort: Pulmonary effort is normal. No respiratory distress.     Breath sounds: Normal breath sounds and air entry. No stridor. No wheezing, rhonchi or rales.  Chest:     Chest wall: No tenderness.  Abdominal:     General: Abdomen is flat. Bowel sounds are normal. There is no distension.     Palpations: Abdomen is soft. There is no mass.     Tenderness: There is no abdominal tenderness. There is no right CVA tenderness, left CVA  tenderness, guarding or rebound.     Hernia: No hernia is present.  Genitourinary:    Comments: Exam deferred; denies complaints Musculoskeletal:        General: No swelling, tenderness, deformity or signs of injury. Normal range of motion.     Cervical back: Full passive range of motion without pain, normal range of motion and neck supple. No edema, rigidity or tenderness. No muscular tenderness.     Right lower leg: No edema.     Left lower leg: No edema.  Lymphadenopathy:     Cervical: No cervical adenopathy.     Right cervical: No superficial, deep or posterior cervical adenopathy.    Left cervical: No superficial, deep or posterior cervical adenopathy.  Skin:    General: Skin is warm and dry.     Capillary Refill: Capillary refill takes less than 2 seconds.     Coloration: Skin is not jaundiced or pale.     Findings: No bruising, erythema, lesion or rash.  Neurological:     General: No focal deficit present.     Mental Status: She is alert and oriented to person, place, and time. Mental status is at baseline.     GCS: GCS eye subscore is 4. GCS verbal subscore is 5. GCS motor subscore is 6.     Sensory: Sensation is intact. No sensory deficit.     Motor: Motor function is intact. No weakness.     Coordination: Coordination  is intact. Coordination normal.     Gait: Gait is intact. Gait normal.  Psychiatric:        Attention and Perception: Attention and perception normal.        Mood and Affect: Mood and affect normal.        Speech: Speech normal.        Behavior: Behavior normal. Behavior is cooperative.        Thought Content: Thought content normal.        Cognition and Memory: Cognition and memory normal.        Judgment: Judgment normal.      Last depression screening scores    05/11/2022    2:47 PM 04/30/2020    2:15 PM 03/29/2019    2:49 PM  PHQ 2/9 Scores  PHQ - 2 Score 0 0 0  PHQ- 9 Score 2 0    Last fall risk screening    05/11/2022    2:47 PM  [place] in the past year? 0  Number falls in past yr: 0  Injury with Fall? 0   Last Audit-C alcohol use screening    05/11/2022    2:48 PM  Alcohol Use Disorder Test (AUDIT)  1. How often do you have a drink containing alcohol? 0  2. How many drinks containing alcohol do you have on a typical day when you are drinking? 0  3. How often do you have six or more drinks on one occasion? 0  AUDIT-C Score 0   A score of 3 or more in women, and 4 or more in men indicates increased risk for alcohol abuse, EXCEPT if all of the points are from question 1   No results found for any visits on 05/11/22.  Assessment & Plan    Routine Health Maintenance and Physical Exam  Exercise Activities and Dietary recommendations  Goals   None     Immunization History  Administered Date(s) Administered   Hepatitis A 03/21/2009, 09/23/2009   Influenza Split 03/21/2009   Influenza,inj,Quad PF,6+ Mos 12/27/2018, 12/31/2020   Influenza-Unspecified 01/03/2017, 01/10/2018, 01/02/2020   Tdap 06/02/2006, 10/05/2016    Health Maintenance  Topic Date Due   COVID-19 Vaccine (1) Never done   INFLUENZA VACCINE  07/11/2022 (Originally 11/10/2021)   PAP SMEAR-Modifier  10/18/2022   MAMMOGRAM  12/24/2022   DTaP/Tdap/Td (3 - Td or Tdap) 10/06/2026   COLONOSCOPY (Pts 45-34yr Insurance coverage will need to be confirmed)  06/17/2031   Hepatitis C Screening  Completed   HIV Screening  Completed   HPV VACCINES  Aged Out    Discussed health benefits of physical activity, and encouraged her to engage in regular exercise appropriate for her age and condition.  Problem List Items Addressed This Visit       Other   Annual physical exam - Primary    UTD on vision and dental [facility] with her psychiatrist Things to do to keep yourself healthy  - Exercise at least 30-45 minutes a day, 3-4 days a week.  - Eat a low-fat diet with lots of fruits and vegetables, up  to 7-9 servings per day.  - Seatbelts can save your life. Wear them always.  - Smoke detectors on every level of your home, check batteries every year.  - Eye Doctor - have an eye exam every 1-2 years  - Safe sex - if you may be exposed to STDs, use a condom.  -  Alcohol -  If you drink, do it moderately, less than 2 drinks per day.  - [place]. Choose someone to speak for you if you are not able.  - Depression is common in our stressful world.If you're feeling down or losing interest in things you normally enjoy, please come in for a visit.  - Violence - If anyone is threatening or hurting you, please call immediately.       Relevant Orders   CBC with Differential/Platelet   Comprehensive Metabolic Panel (CMET)   Lipid panel   TSH   Encounter for screening mammogram for malignant neoplasm of breast    Due for screening for mammogram, denies breast concerns, provided with phone number to call and schedule appointment for mammogram. Encouraged to repeat breast cancer screening every 1-2 years.       Relevant Orders   MM 3D SCREEN BREAST BILATERAL   Encounter for surveillance of contraceptive pills    Wishes to continue current method of OCPs for menstruation support; pt is not sexually active. Of note, reports 1 missed period during times of extreme stress. Continue to monitor. Mom went through menopause early 35s.       Relevant Medications   norgestimate-ethinyl estradiol (ESTARYLLA) 0.25-35 MG-MCG tablet   Hair loss    Acute on chronic, request for vitamin labs Plans to follow up with derm for shaft analysis Reports undue stress with caring for aging father Possibly multifactorial       Relevant Orders   B12 and Folate Panel   Vitamin D (25 hydroxy)   Pure hypercholesterolemia    Chronic, previously elevated Strong familial component Repeat LP recommend diet low in saturated fat and regular exercise - 30 min at least 5 times per week The 10-year  ASCVD risk score (Arnett DK, et al., 2019) is: 0.6%   Values used to calculate the score:     Age: 89 years     Sex: Female     Is Non-Hispanic African American: No     Diabetic: No     Tobacco smoker: No     Systolic Blood Pressure: 237 mmHg     Is BP treated: No     HDL Cholesterol: 83 mg/dL     Total Cholesterol: 261 mg/dL       Relevant Orders   Lipid panel   Return in about 1 year (around 05/12/2023).    Vonna Kotyk, FNP, have reviewed all documentation for this visit. The documentation on 05/12/22 for the exam, diagnosis, procedures, and orders are all accurate and complete.  Gwyneth Sprout, [place] [phone] (phone) [phone] (fax)  [place]

## 2022-05-11 NOTE — Patient Instructions (Signed)
Please call and schedule your mammogram:  [facility]  [address], [address] [facility],  [place]  [address] Get Driving Directions Main: [phone]  Sunday:Closed Monday:7:20 AM - 5:00 PM Tuesday:7:20 AM - 5:00 PM Wednesday:7:20 AM - 5:00 PM Thursday:7:20 AM - 5:00 PM Friday:7:20 AM - 4:30 PM Saturday:Closed  

## 2022-05-12 ENCOUNTER — Encounter: Payer: Self-pay | Admitting: Family Medicine

## 2022-05-12 NOTE — Assessment & Plan Note (Signed)
Due for screening for mammogram, denies breast concerns, provided with phone number to call and schedule appointment for mammogram. Encouraged to repeat breast cancer screening every 1-2 years.  

## 2022-05-12 NOTE — Assessment & Plan Note (Signed)
Chronic, previously elevated Strong familial component Repeat LP recommend diet low in saturated fat and regular exercise - 30 min at least 5 times per week The 10-year ASCVD risk score (Arnett DK, et al., 2019) is: 0.6%   Values used to calculate the score:     Age: 47 years     Sex: Female     Is Non-Hispanic African American: No     Diabetic: No     Tobacco smoker: No     Systolic Blood Pressure: 141 mmHg     Is BP treated: No     HDL Cholesterol: 83 mg/dL     Total Cholesterol: 261 mg/dL

## 2022-05-12 NOTE — Assessment & Plan Note (Signed)
UTD on vision and dental Continues to work at [employer] Has recently increased medications with her psychiatrist Things to do to keep yourself healthy  - Exercise at least 30-45 minutes a day, 3-4 days a week.  - Eat a low-fat diet with lots of fruits and vegetables, up to 7-9 servings per day.  - Seatbelts can save your life. Wear them always.  - Smoke detectors on every level of your home, check batteries every year.  - Eye Doctor - have an eye exam every 1-2 years  - Safe sex - if you may be exposed to STDs, use a condom.  - Alcohol -  If you drink, do it moderately, less than 2 drinks per day.  - [place]. Choose someone to speak for you if you are not able.  - Depression is common in our stressful world.If you're feeling down or losing interest in things you normally enjoy, please come in for a visit.  - Violence - If anyone is threatening or hurting you, please call immediately.

## 2022-05-12 NOTE — Assessment & Plan Note (Signed)
Acute on chronic, request for vitamin labs Plans to follow up with derm for shaft analysis Reports undue stress with caring for aging father Possibly multifactorial

## 2022-05-12 NOTE — Assessment & Plan Note (Signed)
Wishes to continue current method of OCPs for menstruation support; pt is not sexually active. Of note, reports 1 missed period during times of extreme stress. Continue to monitor. Mom went through menopause early 75s.

## 2022-05-13 ENCOUNTER — Other Ambulatory Visit: Payer: Self-pay

## 2022-05-13 DIAGNOSIS — L659 Nonscarring hair loss, unspecified: Secondary | ICD-10-CM

## 2022-05-13 DIAGNOSIS — E7800 Pure hypercholesterolemia, unspecified: Secondary | ICD-10-CM

## 2022-05-13 DIAGNOSIS — Z0000 Encounter for general adult medical examination without abnormal findings: Secondary | ICD-10-CM

## 2022-05-16 LAB — CBC WITH DIFFERENTIAL/PLATELET
Basophils Absolute: 0 10*3/uL (ref 0.0–0.2)
Basos: 1 %
EOS (ABSOLUTE): 0.1 10*3/uL (ref 0.0–0.4)
Eos: 2 %
Hematocrit: 46.1 % (ref 34.0–46.6)
Hemoglobin: 15.3 g/dL (ref 11.1–15.9)
Immature Grans (Abs): 0 10*3/uL (ref 0.0–0.1)
Immature Granulocytes: 0 %
Lymphocytes Absolute: 1.5 10*3/uL (ref 0.7–3.1)
Lymphs: 25 %
MCH: 31 pg (ref 26.6–33.0)
MCHC: 33.2 g/dL (ref 31.5–35.7)
MCV: 93 fL (ref 79–97)
Monocytes Absolute: 0.4 10*3/uL (ref 0.1–0.9)
Monocytes: 6 %
Neutrophils Absolute: 4.1 10*3/uL (ref 1.4–7.0)
Neutrophils: 66 %
Platelets: 173 10*3/uL (ref 150–450)
RBC: 4.94 x10E6/uL (ref 3.77–5.28)
RDW: 11.8 % (ref 11.7–15.4)
WBC: 6.1 10*3/uL (ref 3.4–10.8)

## 2022-05-16 LAB — COMPREHENSIVE METABOLIC PANEL
ALT: 18 IU/L (ref 0–32)
AST: 26 IU/L (ref 0–40)
Albumin/Globulin Ratio: 2 (ref 1.2–2.2)
Albumin: 4.6 g/dL (ref 3.9–4.9)
Alkaline Phosphatase: 49 IU/L (ref 44–121)
BUN/Creatinine Ratio: 11 (ref 9–23)
BUN: 10 mg/dL (ref 6–24)
Bilirubin Total: 0.5 mg/dL (ref 0.0–1.2)
CO2: 23 mmol/L (ref 20–29)
Calcium: 9.2 mg/dL (ref 8.7–10.2)
Chloride: 101 mmol/L (ref 96–106)
Creatinine, Ser: 0.91 mg/dL (ref 0.57–1.00)
Globulin, Total: 2.3 g/dL (ref 1.5–4.5)
Glucose: 85 mg/dL (ref 70–99)
Potassium: 4.4 mmol/L (ref 3.5–5.2)
Sodium: 139 mmol/L (ref 134–144)
Total Protein: 6.9 g/dL (ref 6.0–8.5)
eGFR: 79 mL/min/{1.73_m2} (ref >59)

## 2022-05-16 LAB — LIPID PANEL
Chol/HDL Ratio: 2.9 ratio (ref 0.0–4.4)
Cholesterol, Total: 267 mg/dL — ABNORMAL HIGH (ref 100–199)
HDL: 91 mg/dL (ref >39)
LDL Chol Calc (NIH): 162 mg/dL — ABNORMAL HIGH (ref 0–99)
Triglycerides: 87 mg/dL (ref 0–149)
VLDL Cholesterol Cal: 14 mg/dL (ref 5–40)

## 2022-05-16 LAB — TSH: TSH: 2.84 u[IU]/mL (ref 0.450–4.500)

## 2022-05-16 LAB — VITAMIN D 25 HYDROXY (VIT D DEFICIENCY, FRACTURES): Vit D, 25-Hydroxy: 40.8 ng/mL (ref 30.0–100.0)

## 2022-05-16 LAB — B12 AND FOLATE PANEL
Folate: >20 ng/mL (ref >3.0)
Vitamin B-12: 534 pg/mL (ref 232–1245)

## 2022-05-17 NOTE — Progress Notes (Signed)
Cholesterol remains relatively unchanged; total and LDL remains elevated. Continue to recommend primary statin if desired.  All other labs are normal and stable.  Rebecca Powers, [address] [place], [place] [address] [phone] (phone) [phone] (fax) [place]

## 2022-05-18 ENCOUNTER — Telehealth: Payer: Self-pay

## 2022-05-18 NOTE — Telephone Encounter (Signed)
Pt returned our call for lab results. Shared provider's note.  Gwyneth Sprout, FNP 05/17/2022  6:52 AM EST     Cholesterol remains relatively unchanged; total and LDL remains elevated. Continue to recommend primary statin if desired.   All other labs are normal and stable.   Gwyneth Sprout, [place] [address] [place], [place] [address] [phone] (phone) [phone] (fax) [place] states that she will try a statin, but would like to try it at the lowest/safest dose possible.   Please advise.

## 2022-12-30 ENCOUNTER — Ambulatory Visit
Admission: RE | Admit: 2022-12-30 | Discharge: 2022-12-30 | Disposition: A | Discharge status: Home / Self Care | Payer: BC Managed Care – PPO | Source: Ambulatory Visit | Attending: Family Medicine | Admitting: Family Medicine

## 2022-12-30 DIAGNOSIS — Z1231 Encounter for screening mammogram for malignant neoplasm of breast: Secondary | ICD-10-CM | POA: Insufficient documentation

## 2023-02-08 ENCOUNTER — Ambulatory Visit: Payer: BC Managed Care – PPO | Admitting: Dermatology

## 2023-03-23 ENCOUNTER — Telehealth: Payer: Self-pay | Admitting: Family Medicine

## 2023-04-15 ENCOUNTER — Other Ambulatory Visit: Payer: Self-pay | Admitting: Family Medicine

## 2023-04-15 DIAGNOSIS — Z3041 Encounter for surveillance of contraceptive pills: Secondary | ICD-10-CM

## 2023-04-15 MED ORDER — NORGESTIMATE-ETH ESTRADIOL 0.25-35 MG-MCG PO TABS: 1.0000 | ORAL_TABLET | Freq: Every day | ORAL | 4 refills | Status: DC

## 2023-05-12 ENCOUNTER — Encounter: Payer: Self-pay | Admitting: Family Medicine

## 2023-05-12 ENCOUNTER — Encounter: Payer: BC Managed Care – PPO | Admitting: Family Medicine

## 2023-05-12 ENCOUNTER — Ambulatory Visit (INDEPENDENT_AMBULATORY_CARE_PROVIDER_SITE_OTHER): Payer: BC Managed Care – PPO | Admitting: Family Medicine

## 2023-05-12 VITALS — BP 116/72 | HR 81 | Ht 66.0 in | Wt 148.8 lb

## 2023-05-12 DIAGNOSIS — E7800 Pure hypercholesterolemia, unspecified: Secondary | ICD-10-CM | POA: Diagnosis not present

## 2023-05-12 DIAGNOSIS — Z0000 Encounter for general adult medical examination without abnormal findings: Secondary | ICD-10-CM

## 2023-05-12 DIAGNOSIS — Z3041 Encounter for surveillance of contraceptive pills: Secondary | ICD-10-CM

## 2023-05-12 MED ORDER — NORGESTIMATE-ETH ESTRADIOL 0.25-35 MG-MCG PO TABS: 1.0000 | ORAL_TABLET | Freq: Every day | ORAL | 4 refills | Status: AC

## 2023-05-12 NOTE — Assessment & Plan Note (Signed)
Wishes to continue current method of OCPs for menstruation support pt is not sexually active Normal periods Continue to monitor.  Mom went through menopause early 109s.

## 2023-05-12 NOTE — Assessment & Plan Note (Signed)
Routine labs No pap and or STI testing - per pt no history of sexual activity = pt preference UTD on colon CA screening and mammo Please hold biotin for one week prior to labs Please fast before labs  Things to do to keep yourself healthy  - Exercise at least 30-45 minutes a day, 3-4 days a week.  - Eat a low-fat diet with lots of fruits and vegetables, up to 7-9 servings per day.  - Seatbelts can save your life. Wear them always.  - Smoke detectors on every level of your home, check batteries every year.  - Eye Doctor - have an eye exam every 1-2 years  - Safe sex - if you may be exposed to STDs, use a condom.  - Alcohol -  If you drink, do it moderately, less than 2 drinks per day.  - Health Care Power of Attorney. Choose someone to speak for you if you are not able.  - Depression is common in our stressful world.If you're feeling down or losing interest in things you normally enjoy, please come in for a visit.  - Violence - If anyone is threatening or hurting you, please call immediately.

## 2023-05-12 NOTE — Assessment & Plan Note (Addendum)
Chronic, previously elevated Strong familial component Continue daily omega 3 Repeat LP fasting and will check Lipoprotein A Discussed may need statin in future pt aware and knows risks. recommend diet low in saturated fat and regular exercise - 30 min at least 5 times per week

## 2023-05-12 NOTE — Progress Notes (Signed)
Complete physical exam  Patient: Rebecca Powers   DOB: 11-13-1975   48 y.o. Female  MRN: [id number]  Subjective:    Chief Complaint  Patient presents with   Annual Exam   Rebecca Powers is a 48 y.o. female who presents today for a complete physical exam. She reports consuming a general diet. Gym/ health club routine includes cardio and light weights. She generally feels well. She reports sleeping well. She does have additional problems to discuss today.   Menstruation/ OCPs - hx of uterine cysts, which led to the initiation of birth control to manage heavy menstrual bleeding. Her periods previously lasted up to two weeks with significant bleeding, necessitating frequent tampon changes. Since starting birth control, her periods have become lighter and more manageable, typically lasting from Sunday to Thursday with light flow. She experiences cramps starting Saturday night, which are relieved with Advil. She wishes to continue on OCPs, but as she approaches menopause will readjust. Denies need for STI testing or PAP as she has never been sexually active.   There is a family history of high cholesterol, and she anticipates her lab results may reflect this. She has discussed the potential need for statins due to her genetic predisposition but has not started any treatment. She maintains a healthy lifestyle, working out five to six days a week and eating a balanced diet.  Most recent fall risk assessment:    05/11/2022    2:47 PM  Fall Risk   Falls in the past year? 0  Number falls in past yr: 0  Injury with Fall? 0     Most recent depression screenings:    05/12/2023    4:03 PM 05/11/2022    2:47 PM  PHQ 2/9 Scores  PHQ - 2 Score 0 0  PHQ- 9 Score 2 2    Vision:Within last year and Dental: No current dental problems and Receives regular dental care  Patient Active Problem List   Diagnosis Date Noted   Encounter for screening mammogram for malignant neoplasm of breast 05/11/2022    Encounter for surveillance of contraceptive pills 05/11/2022   Hair loss 05/11/2022   Annual physical exam 05/08/2021   Colon cancer screening 05/08/2021   Allergic rhinitis 05/30/2015   Excess, menstruation 05/30/2015   Pure hypercholesterolemia 05/30/2015   Avitaminosis D 05/30/2015   Bipolar affective disorder (HCC) 06/02/2006   Clinical depression 11/09/2000   Acne erythematosa 07/22/1999   Past Medical History:  Diagnosis Date   Allergy    Bipolar disorder (HCC)    Followed by Dr. Driscilla Moats   Depression    Hyperlipidemia    Thrombocytopenia (HCC) 05/30/2015   Pt has history of this, and has been worked up by hematology. Cause was thought to be related to Lamictal. Pt continues to take Lamictal for bipolar and is stable, therefore no changes to be made in psych meds. Hematology recommends checking CBC with platelets every 6v months. No need for further workup/bone marrow biopsy, unless thrombocytopenia gets much worse.   Past Surgical History:  Procedure Laterality Date   COLONOSCOPY WITH PROPOFOL N/A 06/16/2021   Procedure: COLONOSCOPY WITH PROPOFOL;  Surgeon: Midge Minium, MD;  Location: [facility] ENDOSCOPY;  Service: Endoscopy;  Laterality: N/A;   HAND SURGERY Right    none N/A    Social History   Tobacco Use   Smoking status: Never   Smokeless tobacco: Never  Vaping Use   Vaping status: Never Used  Substance Use Topics   Alcohol  use: No    Alcohol/week: 0.0 standard drinks of alcohol   Drug use: No   Family History  Problem Relation Age of Onset   Hyperlipidemia Mother    Other Father        Monoclonal Gammopathy   Migraines Brother    Hyperlipidemia Maternal Uncle    Hyperlipidemia Maternal Grandfather    Heart disease Maternal Grandfather    Dementia Maternal Grandfather    Hyperlipidemia Paternal Grandfather    Heart disease Paternal Grandfather    Breast cancer Neg Hx    No Known Allergies    Patient Care Team: Sallee Provencal, FNP as PCP -  General (Family Medicine)   Outpatient Medications Prior to Visit  Medication Sig   Azelaic Acid 15 % gel Apply 1 Application topically 2 (two) times daily. After skin is thoroughly washed and patted dry, gently but thoroughly massage a thin film of azelaic acid cream into the affected area twice daily, in the morning and evening.   lamoTRIgine (LAMICTAL) 100 MG tablet Take 2.5 tablets (250 mg total) by mouth daily.   Multiple Vitamin (MULTIVITAMIN ADULT PO)    Multiple Vitamins-Minerals (HAIR SKIN AND NAILS FORMULA PO)    Omega-3 Fatty Acids (FISH OIL) 1200 MG CAPS    [DISCONTINUED] norgestimate-ethinyl estradiol (ESTARYLLA) 0.25-35 MG-MCG tablet Take 1 tablet by mouth daily.   [DISCONTINUED] azelaic acid (AZELEX) 20 % cream Apply topically 2 (two) times daily. After skin is thoroughly washed and patted dry, gently but thoroughly massage a thin film of azelaic acid cream into the affected area twice daily, in the morning and evening.   No facility-administered medications prior to visit.    Review of Systems  All other systems reviewed and are negative.     Objective:     BP 116/72   Pulse 81   Ht 5\' 6"  (1.676 m)   Wt 148 lb 12.8 oz (67.5 kg)   SpO2 100%   BMI 24.02 kg/m  BP Readings from Last 3 Encounters:  05/12/23 116/72  05/11/22 122/74  06/16/21 122/69   Wt Readings from Last 3 Encounters:  05/12/23 148 lb 12.8 oz (67.5 kg)  05/11/22 146 lb 3.2 oz (66.3 kg)  06/16/21 115 lb 3.8 oz (52.3 kg)      Physical Exam Constitutional:      General: She is not in acute distress.    Appearance: Normal appearance. She is normal weight. She is not ill-appearing.  HENT:     Head: Normocephalic.     Right Ear: Tympanic membrane normal.     Left Ear: Tympanic membrane normal.     Nose: Nose normal.     Mouth/Throat:     Mouth: Mucous membranes are moist.     Pharynx: Oropharynx is clear. No oropharyngeal exudate or posterior oropharyngeal erythema.  Eyes:     General: Lids  are normal.     Extraocular Movements: Extraocular movements intact.     Right eye: Normal extraocular motion.     Left eye: Normal extraocular motion.     Conjunctiva/sclera: Conjunctivae normal.     Right eye: Right conjunctiva is not injected.     Left eye: Left conjunctiva is not injected.     Pupils: Pupils are equal, round, and reactive to light.  Neck:     Thyroid: No thyroid mass, thyromegaly or thyroid tenderness.  Cardiovascular:     Rate and Rhythm: Normal rate.     Pulses: Normal pulses.  Radial pulses are 2+ on the right side and 2+ on the left side.       Posterior tibial pulses are 2+ on the right side and 2+ on the left side.     Heart sounds: Normal heart sounds, S1 normal and S2 normal. No murmur heard.    No friction rub. No gallop.  Pulmonary:     Effort: Pulmonary effort is normal. No respiratory distress.     Breath sounds: Normal breath sounds. No stridor. No wheezing, rhonchi or rales.  Chest:     Comments: Not assessed no breast concerns Abdominal:     General: Bowel sounds are normal. There is no distension.     Palpations: Abdomen is soft. There is no mass.     Tenderness: There is no abdominal tenderness. There is no right CVA tenderness, left CVA tenderness, guarding or rebound.     Hernia: No hernia is present.  Genitourinary:    Comments: Not assessed no Gyn concerns Musculoskeletal:        General: No swelling or tenderness. Normal range of motion.     Cervical back: Normal range of motion. No rigidity.  Lymphadenopathy:     Cervical: No cervical adenopathy.     Right cervical: No superficial, deep or posterior cervical adenopathy.    Left cervical: No superficial, deep or posterior cervical adenopathy.  Skin:    General: Skin is warm and dry.     Capillary Refill: Capillary refill takes less than 2 seconds.     Findings: No bruising or erythema.  Neurological:     General: No focal deficit present.     Mental Status: She is alert and  oriented to person, place, and time. Mental status is at baseline.     GCS: GCS eye subscore is 4. GCS verbal subscore is 5. GCS motor subscore is 6.     Cranial Nerves: No cranial nerve deficit.     Sensory: No sensory deficit.     Motor: No weakness, tremor or pronator drift.     Coordination: Romberg sign negative.     Gait: Gait is intact. Gait normal.  Psychiatric:        Attention and Perception: Attention and perception normal.        Mood and Affect: Mood and affect normal.        Speech: Speech normal.        Behavior: Behavior normal. Behavior is cooperative.        Thought Content: Thought content normal.        Cognition and Memory: Cognition and memory normal.        Judgment: Judgment normal.      No results found for any visits on 05/12/23.     Assessment & Plan:    Routine Health Maintenance and Physical Exam  Health Maintenance  Topic Date Due   Flu Shot  11/11/2022   COVID-19 Vaccine (1 - 2024-25 season) Never done   Pap with HPV screening  08/10/2023*   Mammogram  12/30/2023   DTaP/Tdap/Td vaccine (3 - Td or Tdap) 10/06/2026   Colon Cancer Screening  06/17/2031   Hepatitis C Screening  Completed   HIV Screening  Completed   HPV Vaccine  Aged Out  *Topic was postponed. The date shown is not the original due date.    Discussed health benefits of physical activity, and encouraged her to engage in regular exercise appropriate for her age and condition.  Annual physical exam Assessment &  Plan: Routine labs No pap and or STI testing - per pt no history of sexual activity = pt preference UTD on colon CA screening and mammo Please hold biotin for one week prior to labs Please fast before labs  Things to do to keep yourself healthy  - Exercise at least 30-45 minutes a day, 3-4 days a week.  - Eat a low-fat diet with lots of fruits and vegetables, up to 7-9 servings per day.  - Seatbelts can save your life. Wear them always.  - Smoke detectors on every  level of your home, check batteries every year.  - Eye Doctor - have an eye exam every 1-2 years  - Safe sex - if you may be exposed to STDs, use a condom.  - Alcohol -  If you drink, do it moderately, less than 2 drinks per day.  - Health Care Power of Attorney. Choose someone to speak for you if you are not able.  - Depression is common in our stressful world.If you're feeling down or losing interest in things you normally enjoy, please come in for a visit.  - Violence - If anyone is threatening or hurting you, please call immediately.   Orders: -     CBC -     Hemoglobin A1c -     TSH -     Comprehensive metabolic panel  Encounter for surveillance of contraceptive pills Assessment & Plan: Wishes to continue current method of OCPs for menstruation support pt is not sexually active Normal periods Continue to monitor.  Mom went through menopause early 52s.   Orders: -     Norgestimate-Eth Estradiol; Take 1 tablet by mouth daily.  Dispense: 84 tablet; Refill: 4  Pure hypercholesterolemia Assessment & Plan: Chronic, previously elevated Strong familial component Continue daily omega 3 Repeat LP fasting and will check Lipoprotein A Discussed may need statin in future pt aware and knows risks. recommend diet low in saturated fat and regular exercise - 30 min at least 5 times per week    Orders: -     Lipid panel -     Lipoprotein A (LPA)    Will communicate lab results  Return for annual physical.   I, Sallee Provencal, FNP, have reviewed all documentation for this visit. The documentation on 05/12/23 for the exam, diagnosis, procedures, and orders are all accurate and complete.   Sallee Provencal, FNP

## 2023-05-16 ENCOUNTER — Other Ambulatory Visit: Payer: Self-pay

## 2023-05-16 DIAGNOSIS — Z0000 Encounter for general adult medical examination without abnormal findings: Secondary | ICD-10-CM

## 2023-05-16 DIAGNOSIS — E7800 Pure hypercholesterolemia, unspecified: Secondary | ICD-10-CM

## 2023-05-17 ENCOUNTER — Ambulatory Visit: Payer: BC Managed Care – PPO | Admitting: Dermatology

## 2023-05-17 DIAGNOSIS — D2272 Melanocytic nevi of left lower limb, including hip: Secondary | ICD-10-CM

## 2023-05-17 DIAGNOSIS — D229 Melanocytic nevi, unspecified: Secondary | ICD-10-CM

## 2023-05-17 DIAGNOSIS — Z1283 Encounter for screening for malignant neoplasm of skin: Secondary | ICD-10-CM | POA: Diagnosis not present

## 2023-05-17 DIAGNOSIS — L813 Cafe au lait spots: Secondary | ICD-10-CM

## 2023-05-17 DIAGNOSIS — L2489 Irritant contact dermatitis due to other agents: Secondary | ICD-10-CM

## 2023-05-17 DIAGNOSIS — W908XXA Exposure to other nonionizing radiation, initial encounter: Secondary | ICD-10-CM | POA: Diagnosis not present

## 2023-05-17 DIAGNOSIS — L309 Dermatitis, unspecified: Secondary | ICD-10-CM

## 2023-05-17 DIAGNOSIS — D225 Melanocytic nevi of trunk: Secondary | ICD-10-CM

## 2023-05-17 DIAGNOSIS — L649 Androgenic alopecia, unspecified: Secondary | ICD-10-CM

## 2023-05-17 DIAGNOSIS — L578 Other skin changes due to chronic exposure to nonionizing radiation: Secondary | ICD-10-CM | POA: Diagnosis not present

## 2023-05-17 DIAGNOSIS — L719 Rosacea, unspecified: Secondary | ICD-10-CM

## 2023-05-17 DIAGNOSIS — L821 Other seborrheic keratosis: Secondary | ICD-10-CM

## 2023-05-17 DIAGNOSIS — L814 Other melanin hyperpigmentation: Secondary | ICD-10-CM

## 2023-05-17 DIAGNOSIS — L818 Other specified disorders of pigmentation: Secondary | ICD-10-CM

## 2023-05-17 DIAGNOSIS — D1801 Hemangioma of skin and subcutaneous tissue: Secondary | ICD-10-CM

## 2023-05-17 NOTE — Progress Notes (Signed)
 Follow-Up Visit   Subjective  Rebecca Powers is a 48 y.o. female who presents for the following: Skin Cancer Screening and Full Body Skin Exam  Noticed some thinning around temporal hairline, had Covid last fall, but had hair thinning before that.  Brother and father with hair thinning. Taking some biotin and using some grape seed oil    The patient presents for Total-Body Skin Exam (TBSE) for skin cancer screening and mole check. The patient has spots, moles and lesions to be evaluated, some may be new or changing and the patient may have concern these could be cancer.    The following portions of the chart were reviewed this encounter and updated as appropriate: medications, allergies, medical history  Review of Systems:  No other skin or systemic complaints except as noted in HPI or Assessment and Plan.  Objective  Well appearing patient in no apparent distress; mood and affect are within normal limits.  A full examination was performed including scalp, head, eyes, ears, nose, lips, neck, chest, axillae, abdomen, back, buttocks, bilateral upper extremities, bilateral lower extremities, hands, feet, fingers, toes, fingernails, and toenails. All findings within normal limits unless otherwise noted below.   Relevant physical exam findings are noted in the Assessment and Plan.    Assessment & Plan   SKIN CANCER SCREENING PERFORMED TODAY.  ACTINIC DAMAGE - Chronic condition, secondary to cumulative UV/sun exposure - diffuse scaly erythematous macules with underlying dyspigmentation - Recommend daily broad spectrum sunscreen SPF 30+ to sun-exposed areas, reapply every 2 hours as needed.  - Staying in the shade or wearing long sleeves, sun glasses (UVA+UVB protection) and wide brim hats (4-inch brim around the entire circumference of the hat) are also recommended for sun protection.  - Call for new or changing lesions.  LENTIGINES, SEBORRHEIC KERATOSES, HEMANGIOMAS - Benign  normal skin lesions - Benign-appearing - Call for any changes  MELANOCYTIC NEVI - Tan-brown and/or pink-flesh-colored symmetric macules and papules - Left Lateral Calf: 2.5 mm light brown macule - Right Buttock: 5.0 mm medium light brown flat papule - Benign appearing on exam today, stable - Observation - Call clinic for new or changing moles - Recommend daily use of broad spectrum spf 30+ sunscreen to sun-exposed areas.    Cafe au Lait  - 8.0 x 4.5 cm light brown patch of the spinal lower back - Genetic - Benign, observe - Call for any changes  Idiopathic guttate hypomelanosis Legs Exam: Hypopigmented macules    Benign, observe   Rosacea cheeks, nose  Exam :  mild erythema on cheeks and chin with telangectasia,  Resolving Inflammatory papule at left nasal tip  Chronic condition with duration or expected duration over one year. Currently well-controlled.    Rosacea is a chronic progressive skin condition usually affecting the face of adults, causing redness and/or acne bumps. It is treatable but not curable. It sometimes affects the eyes (ocular rosacea) as well. It may respond to topical and/or systemic medication and can flare with stress, sun exposure, alcohol, exercise, topical steroids (including hydrocortisone/cortisone 10) and some foods.  Daily application of broad spectrum spf 30+ sunscreen to face is recommended to reduce flares.   Treatment Plan: Continue Azelaic acid  gel Apply to face QD/BID for rosacea 3 mo supply 3 Rf.    Irritant DERMATITIS at left wrist due to watchband Exam: pink scaly patch at left wrist   Chronic and persistent condition with duration or expected duration over one year. Condition is bothersome/symptomatic for patient. Currently  flared.   Treatment Plan: Recommend switching watch to other wrist while healing Recommend OTC 1% hydrocortisone cream 1-2 times daily to affected area until itchy rash cleared.  Dry skin under watch after  hand washing     ANDROGENETIC ALOPECIA (FEMALE PATTERN HAIR LOSS) Exam: Diffuse thinning of the BL temporal scalp  Chronic and persistent condition with duration or expected duration over one year. Condition is symptomatic/ bothersome to patient. Not currently at goal.   Female Androgenic Alopecia is a chronic condition related to genetics and/or hormonal changes.  In women androgenetic alopecia is commonly associated with menopause but may occur any time after puberty.  It causes hair thinning primarily on the crown with widening of the part and temporal hairline recession.  Can use OTC Rogaine (minoxidil) 5% solution/foam as directed.  Oral treatments in female patients who have no contraindication may include : - Low dose oral minoxidil 1.25 - 5mg  daily - Spironolactone 50 - 100mg  bid - Finasteride 2.5 - 5 mg daily Adjunctive therapies include: - Low Level Laser Light Therapy (LLLT) - Platelet-rich plasma injections (PRP) - Hair Transplants or scalp reduction   Treatment Plan: Pending lab work TSH, Ferritin, Vitamin D  25 Hydroxy, pt may consider oral treatment.  Would need additional office visit for photos and to review oral treatments/side effects Start topical minoxidil 5% at bedtime to aas scalp  Recommend minoxidil 5% (Rogaine for men) solution or foam to be applied to the scalp and left in. This should ideally be used twice daily for best results but it helps with hair regrowth when used at least three times per week. Rogaine initially can cause increased hair shedding for the first few weeks but this will stop with continued use. In studies, people who used minoxidil (Rogaine) for at least 6 months had thicker hair than people who did not. Minoxidil topical (Rogaine) only works as long as it continues to be used. If if it is no longer used then the hair it has been helping to regrow can fall out. Minoxidil topical (Rogaine) can cause increased facial hair growth which can usually  be managed easily with a battery-operated hair trimmer. If facial hair growth is bothersome, switching to the 2% women's version can decrease the risk of unwanted facial hair growth.   Doses of oral minoxidil for hair loss are considered 'low dose'. This is because the doses used for hair loss are much lower than the doses which are used for conditions such as high blood pressure (hypertension). The doses used for hypertension are 10-40mg  per day.  Side effects are uncommon at the low doses (up to 2.5 mg/day) used to treat hair loss. Potential side effects, more commonly seen at higher doses, include: Increase in hair growth (hypertrichosis) elsewhere on face and body Temporary hair shedding upon starting medication which may last up to 4 weeks Ankle swelling, fluid retention, rapid weight gain more than 5 pounds Low blood pressure and feeling lightheaded or dizzy when standing up quickly Fast or irregular heartbeat Headaches  ANDROGENETIC ALOPECIA   Related Procedures Ferritin Vitamin D , 25-hydroxy Return for 1 year tbse .  I, Eleanor Blush, CMA, am acting as scribe for Rexene Rattler, MD.   Documentation: I have reviewed the above documentation for accuracy and completeness, and I agree with the above.  Rexene Rattler, MD

## 2023-05-17 NOTE — Patient Instructions (Addendum)
 Female Androgenic Alopecia is a chronic condition related to genetics and/or hormonal changes.  In women androgenetic alopecia is commonly associated with menopause but may occur any time after puberty.  It causes hair thinning primarily on the crown with widening of the part and temporal hairline recession.  Can use OTC Rogaine (minoxidil) 5% solution/foam as directed.  Oral treatments in female patients who have no contraindication may include : - Low dose oral minoxidil 1.25 - 5mg  daily - Spironolactone 50 - 100mg  bid - Finasteride 2.5 - 5 mg daily Adjunctive therapies include: - Low Level Laser Light Therapy (LLLT) - Platelet-rich plasma injections (PRP) - Hair Transplants or scalp reduction   Doses of minoxidil for hair loss are considered 'low dose'. This is because the doses used for hair loss are much lower than the doses which are used for conditions such as high blood pressure (hypertension). The doses used for hypertension are 10-40mg  per day.  Side effects are uncommon at the low doses (up to 2.5 mg/day) used to treat hair loss. Potential side effects, more commonly seen at higher doses, include: Increase in hair growth (hypertrichosis) elsewhere on face and body Temporary hair shedding upon starting medication which may last up to 4 weeks Ankle swelling, fluid retention, rapid weight gain more than 5 pounds Low blood pressure and feeling lightheaded or dizzy when standing up quickly Fast or irregular heartbeat Headaches    Recommend minoxidil 5% (Rogaine for men) solution or foam to be applied to the scalp and left in. This should ideally be used twice daily for best results but it helps with hair regrowth when used at least three times per week. Rogaine initially can cause increased hair shedding for the first few weeks but this will stop with continued use. In studies, people who used minoxidil (Rogaine) for at least 6 months had thicker hair than people who did not. Minoxidil  topical (Rogaine) only works as long as it continues to be used. If if it is no longer used then the hair it has been helping to regrow can fall out. Minoxidil topical (Rogaine) can cause increased facial hair growth which can usually be managed easily with a battery-operated hair trimmer. If facial hair growth is bothersome, switching to the 2% women's version can decrease the risk of unwanted facial hair growth.      Melanoma ABCDEs  Melanoma is the most dangerous type of skin cancer, and is the leading cause of death from skin disease.  You are more likely to develop melanoma if you: Have light-colored skin, light-colored eyes, or red or blond hair Spend a lot of time in the sun Tan regularly, either outdoors or in a tanning bed Have had blistering sunburns, especially during childhood Have a close family member who has had a melanoma Have atypical moles or large birthmarks  Early detection of melanoma is key since treatment is typically straightforward and cure rates are extremely high if we catch it early.   The first sign of melanoma is often a change in a mole or a new dark spot.  The ABCDE system is a way of remembering the signs of melanoma.  A for asymmetry:  The two halves do not match. B for border:  The edges of the growth are irregular. C for color:  A mixture of colors are present instead of an even brown color. D for diameter:  Melanomas are usually (but not always) greater than 6mm - the size of a pencil eraser. E for  evolution:  The spot keeps changing in size, shape, and color.  Please check your skin once per month between visits. You can use a small mirror in front and a large mirror behind you to keep an eye on the back side or your body.   If you see any new or changing lesions before your next follow-up, please call to schedule a visit.  Please continue daily skin protection including broad spectrum sunscreen SPF 30+ to sun-exposed areas, reapplying every 2 hours  as needed when you're outdoors.   Staying in the shade or wearing long sleeves, sun glasses (UVA+UVB protection) and wide brim hats (4-inch brim around the entire circumference of the hat) are also recommended for sun protection.    Due to recent changes in healthcare laws, you may see results of your pathology and/or laboratory studies on MyChart before the doctors have had a chance to review them. We understand that in some cases there may be results that are confusing or concerning to you. Please understand that not all results are received at the same time and often the doctors may need to interpret multiple results in order to provide you with the best plan of care or course of treatment. Therefore, we ask that you please give us  2 business days to thoroughly review all your results before contacting the office for clarification. Should we see a critical lab result, you will be contacted sooner.   If You Need Anything After Your Visit  If you have any questions or concerns for your doctor, please call our main line at [phone] and press option 4 to reach your doctor's medical assistant. If no one answers, please leave a voicemail as directed and we will return your call as soon as possible. Messages left after 4 pm will be answered the following business day.   You may also send us  a message via MyChart. We typically respond to MyChart messages within 1-2 business days.  For prescription refills, please ask your pharmacy to contact our office. Our fax number is [phone].  If you have an urgent issue when the clinic is closed that cannot wait until the next business day, you can page your doctor at the number below.    Please note that while we do our best to be available for urgent issues outside of office hours, we are not available 24/7.   If you have an urgent issue and are unable to reach us , you may choose to seek medical care at your doctor's office, retail clinic, urgent care  center, or emergency room.  If you have a medical emergency, please immediately call 911 or go to the emergency department.  Pager Numbers  - Dr. Hester: [phone]  - Dr. Jackquline: [phone]  - Dr. Claudene: [phone]   In the event of inclement weather, please call our main line at [phone] for an update on the status of any delays or closures.  Dermatology Medication Tips: Please keep the boxes that topical medications come in in order to help keep track of the instructions about where and how to use these. Pharmacies typically print the medication instructions only on the boxes and not directly on the medication tubes.   If your medication is too expensive, please contact our office at [phone] option 4 or send us  a message through MyChart.   We are unable to tell what your co-pay for medications will be in advance as this is different depending on your insurance coverage. However, we may be  able to find a substitute medication at lower cost or fill out paperwork to get insurance to cover a needed medication.   If a prior authorization is required to get your medication covered by your insurance company, please allow us  1-2 business days to complete this process.  Drug prices often vary depending on where the prescription is filled and some pharmacies may offer cheaper prices.  The website www.goodrx.com contains coupons for medications through different pharmacies. The prices here do not account for what the cost may be with help from insurance (it may be cheaper with your insurance), but the website can give you the price if you did not use any insurance.  - You can print the associated coupon and take it with your prescription to the pharmacy.  - You may also stop by our office during regular business hours and pick up a GoodRx coupon card.  - If you need your prescription sent electronically to a different pharmacy, notify our office through [facility] or by  phone at [phone] option 4.     Si Usted Necesita Algo Despus de Su Visita  Tambin puede enviarnos un mensaje a travs de Clinical Cytogeneticist. Por lo general respondemos a los mensajes de MyChart en el transcurso de 1 a 2 das hbiles.  Para renovar recetas, por favor pida a su farmacia que se ponga en contacto con nuestra oficina. Randi lakes de fax es [place] [phone].  Si tiene un asunto urgente cuando la clnica est cerrada y que no puede esperar hasta el siguiente da hbil, puede llamar/localizar a su doctor(a) al nmero que aparece a continuacin.   Por favor, tenga en cuenta que aunque hacemos todo lo posible para estar disponibles para asuntos urgentes fuera del horario de [place], no estamos disponibles las 24 horas del da, los 7 [address]  [phone] de la [place].   Si tiene un problema urgente y no puede comunicarse con nosotros, puede optar por buscar atencin mdica  en el consultorio de su doctor(a), en una clnica privada, en un centro de atencin urgente o en una sala de emergencias.  Si tiene engineer, drilling, por favor llame inmediatamente al 911 o vaya a la sala de emergencias.  Nmeros de bper  - Dr. Hester: [phone]  - Dra. Jackquline: [phone]  - Dr. Claudene: [phone]   En caso de inclemencias del tiempo, por favor llame a landry capes principal al [phone] para una actualizacin sobre el [place] de cualquier retraso o cierre.  Consejos para la medicacin en dermatologa: Por favor, guarde las cajas en las que vienen los medicamentos de uso tpico para ayudarle a seguir las instrucciones sobre dnde y cmo usarlos. Las farmacias generalmente imprimen las instrucciones del medicamento slo en las cajas y no directamente en los tubos del [place].   Si su medicamento es muy caro, por favor, pngase en contacto con landry rieger llamando al [phone] y presione la opcin 4 o envenos un mensaje a travs de Clinical Cytogeneticist.   No podemos decirle cul ser su copago  por los medicamentos por adelantado ya que esto es diferente dependiendo de la cobertura de su seguro. Sin embargo, es posible que podamos encontrar un medicamento sustituto a audiological scientist un formulario para que el seguro cubra el medicamento que se considera necesario.   Si se requiere una autorizacin previa para que su compaa de seguros cubra su medicamento, por favor permtanos de 1 a 2 das hbiles para completar este proceso.  Los precios de los medicamentos varan  con frecuencia dependiendo del lugar de dnde se surte la receta y alguna farmacias pueden ofrecer precios ms baratos.  El sitio web www.goodrx.com tiene cupones para medicamentos de health and safety inspector. Los precios aqu no tienen en cuenta lo que podra costar con la ayuda del seguro (puede ser ms barato con su seguro), pero el sitio web puede darle el precio si no utiliz tourist information centre manager.  - Puede imprimir el cupn correspondiente y llevarlo con su receta a la farmacia.  - Tambin puede pasar por nuestra oficina durante el horario de atencin regular y education officer, museum una tarjeta de cupones de GoodRx.  - Si necesita que su receta se enve electrnicamente a una farmacia diferente, informe a nuestra oficina a travs de MyChart de [place] o por telfono llamando al [phone] y presione la opcin 4.

## 2023-05-19 ENCOUNTER — Other Ambulatory Visit: Payer: Self-pay

## 2023-05-19 DIAGNOSIS — L649 Androgenic alopecia, unspecified: Secondary | ICD-10-CM

## 2023-05-19 DIAGNOSIS — E7800 Pure hypercholesterolemia, unspecified: Secondary | ICD-10-CM

## 2023-05-19 DIAGNOSIS — Z0000 Encounter for general adult medical examination without abnormal findings: Secondary | ICD-10-CM

## 2023-05-19 NOTE — Addendum Note (Signed)
 Addended by: NEWCOMER MCCLAIN, Cristino Degroff L on: 05/19/2023 07:55 AM   Modules accepted: Orders

## 2023-05-19 NOTE — Addendum Note (Signed)
 Addended by: NEWCOMER MCCLAIN, Quinto Tippy L on: 05/19/2023 08:16 AM   Modules accepted: Orders

## 2023-05-20 LAB — LIPID PANEL

## 2023-05-21 LAB — VITAMIN D 25 HYDROXY (VIT D DEFICIENCY, FRACTURES): Vit D, 25-Hydroxy: 47.7 ng/mL (ref 30.0–100.0)

## 2023-05-21 LAB — LIPID PANEL
Cholesterol, Total: 248 mg/dL — ABNORMAL HIGH (ref 100–199)
HDL: 79 mg/dL (ref >39)
LDL CALC COMMENT:: 3.1 ratio (ref 0.0–4.4)
LDL Chol Calc (NIH): 149 mg/dL — ABNORMAL HIGH (ref 0–99)
Triglycerides: 118 mg/dL (ref 0–149)
VLDL Cholesterol Cal: 20 mg/dL (ref 5–40)

## 2023-05-21 LAB — COMPREHENSIVE METABOLIC PANEL
ALT: 23 IU/L (ref 0–32)
AST: 32 IU/L (ref 0–40)
Albumin: 4.2 g/dL (ref 3.9–4.9)
Alkaline Phosphatase: 51 IU/L (ref 44–121)
BUN/Creatinine Ratio: 10 (ref 9–23)
BUN: 8 mg/dL (ref 6–24)
Bilirubin Total: 0.4 mg/dL (ref 0.0–1.2)
CO2: 22 mmol/L (ref 20–29)
Calcium: 9.4 mg/dL (ref 8.7–10.2)
Chloride: 101 mmol/L (ref 96–106)
Creatinine, Ser: 0.84 mg/dL (ref 0.57–1.00)
Globulin, Total: 2.6 g/dL (ref 1.5–4.5)
Glucose: 85 mg/dL (ref 70–99)
Potassium: 4.3 mmol/L (ref 3.5–5.2)
Sodium: 138 mmol/L (ref 134–144)
Total Protein: 6.8 g/dL (ref 6.0–8.5)
eGFR: 87 mL/min/{1.73_m2} (ref >59)

## 2023-05-21 LAB — CBC
Hematocrit: 41.3 % (ref 34.0–46.6)
Hemoglobin: 13.9 g/dL (ref 11.1–15.9)
MCH: 31.6 pg (ref 26.6–33.0)
MCHC: 33.7 g/dL (ref 31.5–35.7)
MCV: 94 fL (ref 79–97)
Platelets: 166 10*3/uL (ref 150–450)
RBC: 4.4 x10E6/uL (ref 3.77–5.28)
RDW: 12 % (ref 11.7–15.4)
WBC: 5.8 10*3/uL (ref 3.4–10.8)

## 2023-05-21 LAB — HEMOGLOBIN A1C
Est. average glucose Bld gHb Est-mCnc: 105 mg/dL
Hgb A1c MFr Bld: 5.3 % (ref 4.8–5.6)

## 2023-05-21 LAB — TSH: TSH: 2.68 u[IU]/mL (ref 0.450–4.500)

## 2023-05-21 LAB — FERRITIN: Ferritin: 87 ng/mL (ref 15–150)

## 2023-05-21 LAB — LIPOPROTEIN A (LPA): Lipoprotein (a): 31.1 nmol/L (ref <75.0)

## 2023-05-23 ENCOUNTER — Telehealth: Payer: Self-pay

## 2023-05-23 ENCOUNTER — Encounter: Payer: Self-pay | Admitting: Family Medicine

## 2023-05-23 NOTE — Telephone Encounter (Signed)
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  If she wants to start oral treatment, she will need another appointment to discuss further and take baseline photos of her scalp.- please call patient.  Female Androgenic Alopecia is a chronic condition related to genetics and/or hormonal changes.  In women androgenetic alopecia is commonly associated with menopause but may occur any time after puberty.  It causes hair thinning primarily on the crown with widening of the part and temporal hairline recession.  Can use OTC Rogaine (minoxidil) 5% solution/foam as directed.  Oral treatments in female patients who have no contraindication may include : - Low dose oral minoxidil 1.25 - 5mg  daily - Spironolactone 50 - 100mg  bid - Finasteride 2.5 - 5 mg daily

## 2023-05-23 NOTE — Telephone Encounter (Signed)
 Left message for patient to call for lab results.

## 2023-05-26 ENCOUNTER — Telehealth: Payer: Self-pay

## 2023-05-26 NOTE — Telephone Encounter (Signed)
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  If she wants to start oral treatment, she will need another appointment to discuss further and take baseline photos of her scalp.- please call patient.  Female Androgenic Alopecia is a chronic condition related to genetics and/or hormonal changes.  In women androgenetic alopecia is commonly associated with menopause but may occur any time after puberty.  It causes hair thinning primarily on the crown with widening of the part and temporal hairline recession.  Can use OTC Rogaine (minoxidil) 5% solution/foam as directed.  Oral treatments in female patients who have no contraindication may include : - Low dose oral minoxidil 1.25 - 5mg  daily - Spironolactone 50 - 100mg  bid - Finasteride 2.5 - 5 mg daily

## 2023-05-26 NOTE — Telephone Encounter (Signed)
Left pt msg to call for lab results/sh

## 2023-05-26 NOTE — Telephone Encounter (Signed)
Patient returned our call, discussed labs with patient also she can use otc Rogaine 5% nightly. If she wants to start oral treatment, she will need another appointment to discuss further. Patient will call back if she would like an appointment.

## 2023-05-30 DIAGNOSIS — F3181 Bipolar II disorder: Secondary | ICD-10-CM | POA: Diagnosis not present

## 2023-07-05 ENCOUNTER — Ambulatory Visit: Payer: Self-pay

## 2023-07-07 ENCOUNTER — Ambulatory Visit: Payer: Self-pay

## 2023-07-07 NOTE — Progress Notes (Signed)
 Nutrition: 07/07/2023  CC: "I need help with a diet to help lower my cholesterol."  Assessment:  HT: 5'6"   WT: 148 lb   BMI: 24.03  Labs:  Glucose= 85 Total cholesterol = 248 HDL = 79 VLDL= 20 LDL = 149 TG = 118 Lpa = 31.1 (norm =/< 75.0 mg/L.) Gives history of both grandfathers having high cholesterol with MI and CABG.One grandmother experienced high cholesterol.  Mother and Father are on statins for their high cholesterol.  Her brother has high cholesterol but refuses to take a statin.   Her cholesterol has run high but her physician practice only recently addressed the issue at her last visit.  She expresses a desire to not use the medication.  Activity: Active, works out 5-6 days per week rowing for 20-45 minutes.  Will do piliates or yoga on other days. Walks about campus.    Dietary: 7:00 am old fashion oatmeal with skim milk, frozen berries, 1/4 cup of walnuts, 1/2 banana if available.  Will have 1 cup regular coffee with skim milk and a tsp of sugar. Water throughout the morning 12:00 lunch: If meeting will have a salad with meat and a lot of veggies.  If not meeting, will have and apple, large serving of peanut butter, crackers and water. Sometimes will have cheese and crackers.  Snack of trail mix with some chocolate chips in the mix.  Has about a handful. Dinner Zacharia Sowles be canned soup, raw veggies, cheese, crackers.  Will have some chocolate, or dark chocolate covered almonds.  Recommendations: Increase the fiber in your crackers and bread.  Choose whole grain or whole wheat. Look to have barley or other grains in your soups.  You are looking to add fiber. Consult the food label. Consider fish(salmon) chicken more often. Avoid beef and pork. Bake, broil, grill, roast, avoid frying.  Teaching Handouts: Cholesterol and Triglycerides Food and Food Groups for Healthy Eating Food and Food Groups list. Food Label handout  Follow-up: As desired. Rebecca Ersel Enslin, RN, RD,  LDN

## 2023-11-24 DIAGNOSIS — F3181 Bipolar II disorder: Secondary | ICD-10-CM | POA: Diagnosis not present

## 2023-11-29 ENCOUNTER — Encounter: Payer: Self-pay | Admitting: Family Medicine

## 2023-11-29 ENCOUNTER — Ambulatory Visit: Admitting: Family Medicine

## 2023-11-29 ENCOUNTER — Ambulatory Visit: Admitting: Physician Assistant

## 2023-11-29 VITALS — BP 119/64 | HR 66 | Resp 14 | Ht 66.0 in | Wt 149.5 lb

## 2023-11-29 DIAGNOSIS — K469 Unspecified abdominal hernia without obstruction or gangrene: Secondary | ICD-10-CM | POA: Diagnosis not present

## 2023-11-29 NOTE — Progress Notes (Unsigned)
 ACUTE VISIT   Patient: Rebecca Powers   DOB: [birth year]   48 y.o. Female  MRN: [id number]   PCP: Wellington Curtis LABOR, FNP  Chief Complaint  Patient presents with  . Hernia    Noticed 2-3 weeks lump above belly button.  Last week felt different. Denies any pain. Only discomfort. Pt was abroad, family reunion and rows a lot, also mowed the lawn.    Subjective    HPI HPI     Hernia    Additional comments: Noticed 2-3 weeks lump above belly button.  Last week felt different. Denies any pain. Only discomfort. Pt was abroad, family reunion and rows a lot, also mowed the lawn.       Last edited by Sharma Coyer, MD on 11/29/2023  4:16 PM.       Discussed the use of AI scribe software for clinical note transcription with the patient, who gave verbal consent to proceed.  History of Present Illness Rebecca Powers is a 48 year old female who presents with concern for an abdominal hernia superior to her umbilicus.  Approximately two to three weeks ago, she noticed a lump above her umbilicus. Initially, there was no pain, but last week she began to feel discomfort, particularly while she was abroad for a family reunion. The lump becomes more pronounced when she coughs or after eating, causing discomfort. She describes the sensation as 'solid' and 'uncomfortable'.  She engages in regular physical activity, including rowing workouts almost every morning, which she believes strengthens her core. Recently, she has experienced some congestion and coughing, affecting her workout routine. She also mowed the lawn using a riding lawnmower, after which she noticed increased discomfort in the area of the lump.  Her family history includes her father having had hernias before, which makes her familiar with the symptoms. She has not experienced any severe symptoms such as blood in stool or severe pain. She maintains a healthy weight and describes herself as muscular and tall. She reports  no issues with sleep, and the discomfort does not bother her while sleeping. 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     Medications: Outpatient Medications Prior to Visit  Medication Sig  . Azelaic Acid  15 % gel Apply 1 Application topically 2 (two) times daily. After skin is thoroughly washed and patted dry, gently but thoroughly massage a thin film of azelaic acid  cream into the affected area twice daily, in the morning and evening.  . lamoTRIgine  (LAMICTAL ) 100 MG tablet Take 2.5 tablets (250 mg total) by mouth daily.  . Multiple Vitamin (MULTIVITAMIN ADULT PO)   . Multiple Vitamins-Minerals (HAIR SKIN AND NAILS FORMULA PO)   . norgestimate -ethinyl estradiol  (ESTARYLLA) 0.25-35 MG-MCG tablet Take 1 tablet by mouth daily.  . Omega-3 Fatty Acids (FISH OIL) 1200 MG CAPS    No facility-administered medications prior to visit.    {Insert previous labs (optional):23779} {See past labs  Heme  Chem  Endocrine  Serology  Results Review (optional):1}   Objective    BP 119/64 (BP Location: Right Arm, Patient Position: Sitting, Cuff Size: Normal)   Pulse 66   Resp 14   Ht 5' 6 (1.676 m)   Wt 149 lb 8 oz (67.8 kg)   SpO2 100%   BMI 24.13 kg/m  {Insert last BP/Wt (optional):23777}{See vitals history (optional):1}  Physical Exam   Physical Exam ABDOMEN: Palpable lump superior to umbilicus, non-distended,  no tenderness elsewhere.   No results found for any visits on 11/29/23.  Assessment & Plan     Assessment and Plan Assessment & Plan Abdominal wall hernia, supraumbilical Presents with a supraumbilical abdominal wall hernia, noted as a lump above the umbilicus for the past two to three weeks. The hernia is asymptomatic in terms of pain but causes discomfort, especially after physical activities such as mowing the lawn and rowing. The lump becomes more prominent with activities like coughing or eating. The hernia is not  large, and there is no evidence of gangrene or other complications. She is symptomatic, and the hernia is causing significant discomfort, warranting further evaluation by general surgery. Discussed the potential for incarceration and the need for emergency care if severe symptoms develop. Hernia repair surgery was mentioned as a potential treatment, with a recovery period that may include a six-week restriction from physical activities. - Refer to general surgery for evaluation and management of the hernia. - Advise to avoid activities that exacerbate discomfort. - Instruct to seek emergency care if severe pain, hematochezia, or signs of incarceration occur.      No follow-ups on file.        Rockie Agent, MD  [facility] [phone] (phone) [phone] (fax)  [facility]

## 2023-12-01 ENCOUNTER — Other Ambulatory Visit: Payer: Self-pay

## 2023-12-01 ENCOUNTER — Encounter: Payer: Self-pay | Admitting: Family Medicine

## 2023-12-01 ENCOUNTER — Telehealth: Payer: Self-pay

## 2023-12-01 DIAGNOSIS — Z1231 Encounter for screening mammogram for malignant neoplasm of breast: Secondary | ICD-10-CM

## 2023-12-01 NOTE — Telephone Encounter (Signed)
 Done

## 2023-12-01 NOTE — Telephone Encounter (Signed)
 Copied from CRM [id number]. Topic: Clinical - Request for Lab/Test Order >> Dec 01, 2023  3:17 PM Montie POUR wrote: Reason for CRM:  Rebecca Powers needs an order put in for her yearly mammogram so she can call and schedule. Please send her a message through MyChart when completed.

## 2023-12-05 NOTE — H&P (View-Only) (Signed)
 Patient ID: Rebecca Powers, female   DOB: 17-Mar-1976, 48 y.o.   MRN: [id number]  Chief Complaint: Supraumbilical hernia  History of Present Illness Rebecca Powers is a 48 y.o. female with apparent development over the last month of a supraumbilical bulge with associated discomfort.  Sometimes the discomfort is merely after eating.  Not much exacerbation with lifting or activity.  Has been quite active with travel, moving furniture etc. in recent times but no clear precipitating event.  Sometimes the knot is hard to touch.  She denies any history of nausea, vomiting, fevers or chills.  She denies any history of straining or constipation..  Past Medical History Past Medical History:  Diagnosis Date   Allergy    Bipolar disorder (HCC)    Followed by Dr. Manus Dan   Depression    Hyperlipidemia    Thrombocytopenia (HCC) 05/30/2015   Pt has history of this, and has been worked up by hematology. Cause was thought to be related to Lamictal . Pt continues to take Lamictal  for bipolar and is stable, therefore no changes to be made in psych meds. Hematology recommends checking CBC with platelets every 6v months. No need for further workup/bone marrow biopsy, unless thrombocytopenia gets much worse.      Past Surgical History:  Procedure Laterality Date   COLONOSCOPY WITH PROPOFOL  N/A 06/16/2021   Procedure: COLONOSCOPY WITH PROPOFOL ;  Surgeon: Jinny Carmine, MD;  Location: [facility] ENDOSCOPY;  Service: Endoscopy;  Laterality: N/A;   HAND SURGERY Right    none N/A     No Known Allergies  Current Outpatient Medications  Medication Sig Dispense Refill   Azelaic Acid  15 % gel Apply 1 Application topically 2 (two) times daily. After skin is thoroughly washed and patted dry, gently but thoroughly massage a thin film of azelaic acid  cream into the affected area twice daily, in the morning and evening. 150 g 2   lamoTRIgine  (LAMICTAL ) 100 MG tablet Take 2.5 tablets (250 mg total) by mouth daily. 2.5 tablet  0   Multiple Vitamin (MULTIVITAMIN ADULT PO)      Multiple Vitamins-Minerals (HAIR SKIN AND NAILS FORMULA PO)      norgestimate -ethinyl estradiol  (ESTARYLLA) 0.25-35 MG-MCG tablet Take 1 tablet by mouth daily. 84 tablet 4   Omega-3 Fatty Acids (FISH OIL) 1200 MG CAPS      No current facility-administered medications for this visit.    Family History Family History  Problem Relation Age of Onset   Hyperlipidemia Mother    Other Father        Monoclonal Gammopathy   Migraines Brother    Hyperlipidemia Maternal Uncle    Hyperlipidemia Maternal Grandfather    Heart disease Maternal Grandfather    Dementia Maternal Grandfather    Hyperlipidemia Paternal Grandfather    Heart disease Paternal Grandfather    Breast cancer Neg Hx       Social History Social History   Tobacco Use   Smoking status: Never   Smokeless tobacco: Never  Vaping Use   Vaping status: Never Used  Substance Use Topics   Alcohol use: No    Alcohol/week: 0.0 standard drinks of alcohol   Drug use: No        Review of Systems  Constitutional: Negative.   HENT: Negative.    Eyes: Negative.   Respiratory:  Positive for cough.   Cardiovascular: Negative.   Gastrointestinal:  Positive for abdominal pain.  Genitourinary: Negative.   Skin: Negative.   Neurological: Negative.   Psychiatric/Behavioral: Negative.  Physical Exam Blood pressure (!) 127/90, pulse 82, temperature 98.1 F (36.7 C), temperature source Oral, height 5' 6 (1.676 m), weight 146 lb 9.6 oz (66.5 kg), SpO2 99%. Last Weight  Most recent update: 12/06/2023  2:26 PM    Weight  66.5 kg (146 lb 9.6 oz)             CONSTITUTIONAL: Well developed, and nourished, appropriately responsive and aware without distress.   EYES: Sclera non-icteric.   EARS, NOSE, MOUTH AND THROAT:  The oropharynx is clear. Oral mucosa is pink and moist.    Hearing is intact to voice.  NECK: Trachea is midline, and there is no jugular venous distension.   LYMPH NODES:  Lymph nodes in the neck are not appreciated. RESPIRATORY:  Lungs are clear, and breath sounds are equal bilaterally.  Normal respiratory effort without pathologic use of accessory muscles. CARDIOVASCULAR: Heart is regular in rate and rhythm.   Well perfused.  GI: The abdomen is notable for a small centimeter sized area of silk sign just 2 cm cephalad to the umbilicus, no particular observable bulge with head lifting or Valsalva.  I believe I can palpate a small subcentimeter sized defect between very well-approximated recti to the midline.  Otherwise soft, nontender, and nondistended. There were no palpable masses.  MUSCULOSKELETAL:  Symmetrical muscle tone appreciated in all four extremities.    SKIN: Skin turgor is normal. No pathologic skin lesions appreciated.  NEUROLOGIC:  Motor and sensation appear grossly normal.  Cranial nerves are grossly without defect. PSYCH:  Alert and oriented to person, place and time. Affect is appropriate for situation.  Data Reviewed I have personally reviewed what is currently available of the patient's imaging, recent labs and medical records.   Labs:     Latest Ref Rng & Units 05/19/2023    7:50 AM 05/13/2022    7:46 AM 04/23/2021    8:10 AM  CBC  WBC 3.4 - 10.8 x10E3/uL 5.8  6.1  5.1   Hemoglobin 11.1 - 15.9 g/dL 86.0  84.6  85.8   Hematocrit 34.0 - 46.6 % 41.3  46.1  42.4   Platelets 150 - 450 x10E3/uL 166  173  196       Latest Ref Rng & Units 05/19/2023    7:50 AM 05/13/2022    7:46 AM 04/23/2021    8:10 AM  CMP  Glucose 70 - 99 mg/dL 85  85  87   BUN 6 - 24 mg/dL 8  10  10    Creatinine 0.57 - 1.00 mg/dL 9.15  9.08  8.96   Sodium 134 - 144 mmol/L 138  139  138   Potassium 3.5 - 5.2 mmol/L 4.3  4.4  4.4   Chloride 96 - 106 mmol/L 101  101  101   CO2 20 - 29 mmol/L 22  23  24    Calcium 8.7 - 10.2 mg/dL 9.4  9.2  9.5   Total Protein 6.0 - 8.5 g/dL 6.8  6.9  6.9   Total Bilirubin 0.0 - 1.2 mg/dL 0.4  0.5  0.6   Alkaline Phos 44 - 121  IU/L 51  49  49   AST 0 - 40 IU/L 32  26  29   ALT 0 - 32 IU/L 23  18  20      Imaging: Radiological images personally reviewed:   Within last 24 hrs: No results found.  Assessment    Epigastric hernia, initial, reducible, estimated fascial defect diameter less than  3 cm easily. Patient Active Problem List   Diagnosis Date Noted   Encounter for screening mammogram for malignant neoplasm of breast 05/11/2022   Encounter for surveillance of contraceptive pills 05/11/2022   Hair loss 05/11/2022   Annual physical exam 05/08/2021   Colon cancer screening 05/08/2021   Allergic rhinitis 05/30/2015   Excess, menstruation 05/30/2015   Pure hypercholesterolemia 05/30/2015   Avitaminosis D 05/30/2015   Bipolar affective disorder (HCC) 06/02/2006   Clinical depression 11/09/2000   Acne erythematosa 07/22/1999    Plan    Primary repair of midline fascial defect, less than 3 cm in size, epigastric in location, initial and reducible.  I discussed possibility of incarceration, strangulation, enlargement in size over time, and the need for emergency surgery in the face of these.  Also reviewed the techniques of reduction should incarceration occur, and when unsuccessful to present to the ED.  Also discussed that surgery risks include recurrence which can be up to 30% in the case of complex hernias, use of prosthetic materials (mesh) and the increased risk of infection and the possible need for re-operation and removal of mesh, possibility of post-op SBO or ileus, and the risks of general anesthetic including heart attack, stroke, sudden death or some reaction to anesthetic medications. The patient, and those present, appear to understand the risks, any and all questions were answered to the patient's satisfaction.  No guarantees were ever expressed or implied.   I personally spent a total of 30 minutes in the care of the patient today including preparing to see the patient, getting/reviewing  separately obtained history, performing a medically appropriate exam/evaluation, placing orders, and documenting clinical information in the EHR.   These notes generated with voice recognition software. I apologize for typographical errors.  [name] M.D., FACS 12/06/2023, 3:04 PM

## 2023-12-05 NOTE — Progress Notes (Unsigned)
 Patient ID: Rebecca Powers, female   DOB: 1975-10-27, 48 y.o.   MRN: [id number]  Chief Complaint: Supraumbilical hernia  History of Present Illness Rebecca Powers is a 48 y.o. female with apparent development over the last month of a supraumbilical bulge with associated discomfort.  Sometimes the discomfort is merely after eating.  Not much exacerbation with lifting or activity.  Has been quite active with travel, moving furniture etc. in recent times but no clear precipitating event.  Sometimes the knot is hard to touch.  She denies any history of nausea, vomiting, fevers or chills.  She denies any history of straining or constipation..  Past Medical History Past Medical History:  Diagnosis Date   Allergy    Bipolar disorder (HCC)    Followed by Dr. Manus Dan   Depression    Hyperlipidemia    Thrombocytopenia (HCC) 05/30/2015   Pt has history of this, and has been worked up by hematology. Cause was thought to be related to Lamictal . Pt continues to take Lamictal  for bipolar and is stable, therefore no changes to be made in psych meds. Hematology recommends checking CBC with platelets every 6v months. No need for further workup/bone marrow biopsy, unless thrombocytopenia gets much worse.      Past Surgical History:  Procedure Laterality Date   COLONOSCOPY WITH PROPOFOL  N/A 06/16/2021   Procedure: COLONOSCOPY WITH PROPOFOL ;  Surgeon: Jinny Carmine, MD;  Location: [facility] ENDOSCOPY;  Service: Endoscopy;  Laterality: N/A;   HAND SURGERY Right    none N/A     No Known Allergies  Current Outpatient Medications  Medication Sig Dispense Refill   Azelaic Acid  15 % gel Apply 1 Application topically 2 (two) times daily. After skin is thoroughly washed and patted dry, gently but thoroughly massage a thin film of azelaic acid  cream into the affected area twice daily, in the morning and evening. 150 g 2   lamoTRIgine  (LAMICTAL ) 100 MG tablet Take 2.5 tablets (250 mg total) by mouth daily. 2.5 tablet  0   Multiple Vitamin (MULTIVITAMIN ADULT PO)      Multiple Vitamins-Minerals (HAIR SKIN AND NAILS FORMULA PO)      norgestimate -ethinyl estradiol  (ESTARYLLA) 0.25-35 MG-MCG tablet Take 1 tablet by mouth daily. 84 tablet 4   Omega-3 Fatty Acids (FISH OIL) 1200 MG CAPS      No current facility-administered medications for this visit.    Family History Family History  Problem Relation Age of Onset   Hyperlipidemia Mother    Other Father        Monoclonal Gammopathy   Migraines Brother    Hyperlipidemia Maternal Uncle    Hyperlipidemia Maternal Grandfather    Heart disease Maternal Grandfather    Dementia Maternal Grandfather    Hyperlipidemia Paternal Grandfather    Heart disease Paternal Grandfather    Breast cancer Neg Hx       Social History Social History   Tobacco Use   Smoking status: Never   Smokeless tobacco: Never  Vaping Use   Vaping status: Never Used  Substance Use Topics   Alcohol use: No    Alcohol/week: 0.0 standard drinks of alcohol   Drug use: No        Review of Systems  Constitutional: Negative.   HENT: Negative.    Eyes: Negative.   Respiratory:  Positive for cough.   Cardiovascular: Negative.   Gastrointestinal:  Positive for abdominal pain.  Genitourinary: Negative.   Skin: Negative.   Neurological: Negative.   Psychiatric/Behavioral: Negative.  Physical Exam Blood pressure (!) 127/90, pulse 82, temperature 98.1 F (36.7 C), temperature source Oral, height 5' 6 (1.676 m), weight 146 lb 9.6 oz (66.5 kg), SpO2 99%. Last Weight  Most recent update: 12/06/2023  2:26 PM    Weight  66.5 kg (146 lb 9.6 oz)             CONSTITUTIONAL: Well developed, and nourished, appropriately responsive and aware without distress.   EYES: Sclera non-icteric.   EARS, NOSE, MOUTH AND THROAT:  The oropharynx is clear. Oral mucosa is pink and moist.    Hearing is intact to voice.  NECK: Trachea is midline, and there is no jugular venous distension.   LYMPH NODES:  Lymph nodes in the neck are not appreciated. RESPIRATORY:  Lungs are clear, and breath sounds are equal bilaterally.  Normal respiratory effort without pathologic use of accessory muscles. CARDIOVASCULAR: Heart is regular in rate and rhythm.   Well perfused.  GI: The abdomen is notable for a small centimeter sized area of silk sign just 2 cm cephalad to the umbilicus, no particular observable bulge with head lifting or Valsalva.  I believe I can palpate a small subcentimeter sized defect between very well-approximated recti to the midline.  Otherwise soft, nontender, and nondistended. There were no palpable masses.  MUSCULOSKELETAL:  Symmetrical muscle tone appreciated in all four extremities.    SKIN: Skin turgor is normal. No pathologic skin lesions appreciated.  NEUROLOGIC:  Motor and sensation appear grossly normal.  Cranial nerves are grossly without defect. PSYCH:  Alert and oriented to person, place and time. Affect is appropriate for situation.  Data Reviewed I have personally reviewed what is currently available of the patient's imaging, recent labs and medical records.   Labs:     Latest Ref Rng & Units 05/19/2023    7:50 AM 05/13/2022    7:46 AM 04/23/2021    8:10 AM  CBC  WBC 3.4 - 10.8 x10E3/uL 5.8  6.1  5.1   Hemoglobin 11.1 - 15.9 g/dL 86.0  84.6  85.8   Hematocrit 34.0 - 46.6 % 41.3  46.1  42.4   Platelets 150 - 450 x10E3/uL 166  173  196       Latest Ref Rng & Units 05/19/2023    7:50 AM 05/13/2022    7:46 AM 04/23/2021    8:10 AM  CMP  Glucose 70 - 99 mg/dL 85  85  87   BUN 6 - 24 mg/dL 8  10  10    Creatinine 0.57 - 1.00 mg/dL 9.15  9.08  8.96   Sodium 134 - 144 mmol/L 138  139  138   Potassium 3.5 - 5.2 mmol/L 4.3  4.4  4.4   Chloride 96 - 106 mmol/L 101  101  101   CO2 20 - 29 mmol/L 22  23  24    Calcium 8.7 - 10.2 mg/dL 9.4  9.2  9.5   Total Protein 6.0 - 8.5 g/dL 6.8  6.9  6.9   Total Bilirubin 0.0 - 1.2 mg/dL 0.4  0.5  0.6   Alkaline Phos 44 - 121  IU/L 51  49  49   AST 0 - 40 IU/L 32  26  29   ALT 0 - 32 IU/L 23  18  20      Imaging: Radiological images personally reviewed:   Within last 24 hrs: No results found.  Assessment    Epigastric hernia, initial, reducible, estimated fascial defect diameter less than  3 cm easily. Patient Active Problem List   Diagnosis Date Noted   Encounter for screening mammogram for malignant neoplasm of breast 05/11/2022   Encounter for surveillance of contraceptive pills 05/11/2022   Hair loss 05/11/2022   Annual physical exam 05/08/2021   Colon cancer screening 05/08/2021   Allergic rhinitis 05/30/2015   Excess, menstruation 05/30/2015   Pure hypercholesterolemia 05/30/2015   Avitaminosis D 05/30/2015   Bipolar affective disorder (HCC) 06/02/2006   Clinical depression 11/09/2000   Acne erythematosa 07/22/1999    Plan    Primary repair of midline fascial defect, less than 3 cm in size, epigastric in location, initial and reducible.  I discussed possibility of incarceration, strangulation, enlargement in size over time, and the need for emergency surgery in the face of these.  Also reviewed the techniques of reduction should incarceration occur, and when unsuccessful to present to the ED.  Also discussed that surgery risks include recurrence which can be up to 30% in the case of complex hernias, use of prosthetic materials (mesh) and the increased risk of infection and the possible need for re-operation and removal of mesh, possibility of post-op SBO or ileus, and the risks of general anesthetic including heart attack, stroke, sudden death or some reaction to anesthetic medications. The patient, and those present, appear to understand the risks, any and all questions were answered to the patient's satisfaction.  No guarantees were ever expressed or implied.   I personally spent a total of 30 minutes in the care of the patient today including preparing to see the patient, getting/reviewing  separately obtained history, performing a medically appropriate exam/evaluation, placing orders, and documenting clinical information in the EHR.   These notes generated with voice recognition software. I apologize for typographical errors.  [name] M.D., FACS 12/06/2023, 3:04 PM

## 2023-12-06 ENCOUNTER — Encounter: Payer: Self-pay | Admitting: Surgery

## 2023-12-06 ENCOUNTER — Ambulatory Visit: Admitting: Surgery

## 2023-12-06 ENCOUNTER — Ambulatory Visit: Payer: Self-pay | Admitting: Surgery

## 2023-12-06 VITALS — BP 127/90 | HR 82 | Temp 98.1°F | Ht 66.0 in | Wt 146.6 lb

## 2023-12-06 DIAGNOSIS — K439 Ventral hernia without obstruction or gangrene: Secondary | ICD-10-CM | POA: Insufficient documentation

## 2023-12-06 NOTE — Patient Instructions (Signed)
 You have requested for your Umbilical Hernia be repaired. This will be scheduled with Dr. Ofilia Benton at [facility].   If you are on any injectable weight loss medication, you will need to stop taking your GLP-1 injectable (weight loss) medications 8 days before your surgery to avoid any complications with anesthesia.   Please see your (blue)pre-care sheet for information. Our surgery scheduler will call you to verify surgery date and to go over information.   You will need to arrange to be off work for 1-2 weeks but will have to have a lifting restriction of no more than 15 lbs for 6 weeks following your surgery. If you have FMLA or disability paperwork that needs filled out you may drop this off at our office or this can be faxed to [phone].     Umbilical Hernia, Adult A hernia is a bulge of tissue that pushes through an opening between muscles. An umbilical hernia happens in the abdomen, near the belly button (umbilicus). The hernia may contain tissues from the small intestine, large intestine, or fatty tissue covering the intestines (omentum). Umbilical hernias in adults tend to get worse over time, and they require surgical treatment. There are several types of umbilical hernias. You may have: A hernia located just above or below the umbilicus (indirect hernia). This is the most common type of umbilical hernia in adults. A hernia that forms through an opening formed by the umbilicus (direct hernia). A hernia that comes and goes (reducible hernia). A reducible hernia may be visible only when you strain, lift something heavy, or cough. This type of hernia can be pushed back into the abdomen (reduced). A hernia that traps abdominal tissue inside the hernia (incarcerated hernia). This type of hernia cannot be reduced. A hernia that cuts off blood flow to the tissues inside the hernia (strangulated hernia). The tissues can start to die if this happens. This type of  hernia requires emergency treatment.  What are the causes? An umbilical hernia happens when tissue inside the abdomen presses on a weak area of the abdominal muscles. What increases the risk? You may have a greater risk of this condition if you: Are obese. Have had several pregnancies. Have a buildup of fluid inside your abdomen (ascites). Have had surgery that weakens the abdominal muscles.  What are the signs or symptoms? The main symptom of this condition is a painless bulge at or near the belly button. A reducible hernia may be visible only when you strain, lift something heavy, or cough. Other symptoms may include: Dull pain. A feeling of pressure.  Symptoms of a strangulated hernia may include: Pain that gets increasingly worse. Nausea and vomiting. Pain when pressing on the hernia. Skin over the hernia becoming red or purple. Constipation. Blood in the stool.  How is this diagnosed? This condition may be diagnosed based on: A physical exam. You may be asked to cough or strain while standing. These actions increase the pressure inside your abdomen and force the hernia through the opening in your muscles. Your health care provider may try to reduce the hernia by pressing on it. Your symptoms and medical history.  How is this treated? Surgery is the only treatment for an umbilical hernia. Surgery for a strangulated hernia is done as soon as possible. If you have a small hernia that is not incarcerated, you may need to lose weight before having surgery. Follow these instructions at home: Lose weight, if told by your health care provider.  Do not try to push the hernia back in. Watch your hernia for any changes in color or size. Tell your health care provider if any changes occur. You may need to avoid activities that increase pressure on your hernia. Do not lift anything that is heavier than 10 lb (4.5 kg) until your health care provider says that this is safe. Take  over-the-counter and prescription medicines only as told by your health care provider. Keep all follow-up visits as told by your health care provider. This is important. Contact a health care provider if: Your hernia gets larger. Your hernia becomes painful. Get help right away if: You develop sudden, severe pain near the area of your hernia. You have pain as well as nausea or vomiting. You have pain and the skin over your hernia changes color. You develop a fever. This information is not intended to replace advice given to you by your health care provider. Make sure you discuss any questions you have with your health care provider. Document Released: 08/29/2015 Document Revised: 11/30/2015 Document Reviewed: 08/29/2015 Elsevier Interactive Patient Education  [employer].

## 2023-12-07 ENCOUNTER — Telehealth: Payer: Self-pay | Admitting: Surgery

## 2023-12-07 NOTE — Telephone Encounter (Signed)
 Patient has been advised of Pre-Admission date/time, and Surgery date at [facility].  Surgery Date: 12/14/23 Preadmission Testing Date: 12/09/23 (phone 1p-4p)  Patient informed of the scheduling process and surgery information given at time of office visit.   Patient has been made aware to call [phone], between 1-3:00pm the day before surgery, to find out what time to arrive for surgery.

## 2023-12-09 ENCOUNTER — Other Ambulatory Visit: Payer: Self-pay

## 2023-12-09 ENCOUNTER — Inpatient Hospital Stay: Admission: RE | Admit: 2023-12-09 | Source: Ambulatory Visit

## 2023-12-09 ENCOUNTER — Encounter
Admission: RE | Admit: 2023-12-09 | Discharge: 2023-12-09 | Disposition: A | Discharge status: Home / Self Care | Source: Ambulatory Visit | Attending: Surgery | Admitting: Surgery

## 2023-12-09 DIAGNOSIS — Z01818 Encounter for other preprocedural examination: Secondary | ICD-10-CM

## 2023-12-09 HISTORY — DX: Ventral hernia without obstruction or gangrene: K43.9

## 2023-12-09 NOTE — Patient Instructions (Addendum)
 Your procedure is scheduled on: [facility] 12/14/23 Report to the Registration Desk on the 1st floor of the Medical Mall.  To find out your arrival time, please call [phone] between 1PM - 3PM on: TUESDAY 12/13/23 If your arrival time is 6:00 am, do not arrive before that time as the Medical Mall entrance doors do not open until 6:00 am.  REMEMBER: Instructions that are not followed completely may result in serious medical risk, up to and including death; or upon the discretion of your surgeon and anesthesiologist your surgery may need to be rescheduled.  Do not eat food after midnight the night before surgery.  No gum chewing or hard candies.   You may however, drink CLEAR liquids up to 2 hours before you are scheduled to arrive for your surgery. Do not drink anything within 2 hours of your scheduled arrival time.  Clear liquids include: - water  - apple juice without pulp - gatorade (not RED colors) - black coffee or tea (Do NOT add milk or creamers to the coffee or tea) Do NOT drink anything that is not on this list.  One week prior to surgery: Stop Anti-inflammatories (NSAIDS) such as Advil, Aleve, Ibuprofen, Motrin, Naproxen, Naprosyn and Aspirin based products such as Excedrin, Goody's Powder, BC Powder.  Stop ANY OVER THE COUNTER supplements until after surgery.  You may however, continue to take Tylenol if needed for pain up until the day of surgery.  Continue taking all of your other prescription medications up until the day of surgery.  ON THE DAY OF SURGERY ONLY TAKE THESE MEDICATIONS WITH SIPS OF WATER:  Lamotrigine  (LAMICTAL )   Use inhalers on the day of surgery and bring to the hospital.  No Alcohol for 24 hours before or after surgery.  No Smoking including e-cigarettes for 24 hours before surgery.  No chewable tobacco products for at least 6 hours before surgery.  No nicotine patches on the day of surgery.  Do not use any recreational drugs for at least a  week (preferably 2 weeks) before your surgery.  Please be advised that the combination of cocaine and anesthesia may have negative outcomes, up to and including death. If you test positive for cocaine, your surgery will be cancelled.  On the morning of surgery brush your teeth with toothpaste and water, you may rinse your mouth with mouthwash if you wish. Do not swallow any toothpaste or mouthwash.  Use CHG Soap or wipes as directed on instruction sheet.  Do not wear jewelry, make-up, hairpins, clips or nail polish.  For welded (permanent) jewelry: bracelets, anklets, waist bands, etc.  Please have this removed prior to surgery.  If it is not removed, there is a chance that hospital personnel will need to cut it off on the day of surgery.  Do not wear lotions, powders, or perfumes.   Do not shave body hair from the neck down 48 hours before surgery.  Contact lenses, hearing aids and dentures may not be worn into surgery.  Do not bring valuables to the hospital. [facility] is not responsible for any missing/lost belongings or valuables.   Notify your doctor if there is any change in your medical condition (cold, fever, infection).  Wear comfortable clothing (specific to your surgery type) to the hospital.  After surgery, you can help prevent lung complications by doing breathing exercises.  Take deep breaths and cough every 1-2 hours. Your doctor may order a device called an Incentive Spirometer to help you take deep  breaths. When coughing or sneezing, hold a pillow firmly against your incision with both hands. This is called "splinting." Doing this helps protect your incision. It also decreases belly discomfort.  If you are being discharged the day of surgery, you will not be allowed to drive home. You will need a responsible individual to drive you home and stay with you for 24 hours after surgery.   If you are taking public transportation, you will need to have a responsible  individual with you.  Please call the Pre-admissions Testing Dept. at [phone] if you have any questions about these instructions.  Surgery Visitation Policy:  Patients having surgery or a procedure may have two visitors.  Children under the age of 9 must have an adult with them who is not the patient.   Merchandiser, retail to address health-related social needs:  [email or web address]                                                                                                             Preparing for Surgery with CHLORHEXIDINE GLUCONATE (CHG) Soap  Chlorhexidine Gluconate (CHG) Soap  o An antiseptic cleaner that kills germs and bonds with the skin to continue killing germs even after washing  o Used for showering the night before surgery and morning of surgery  Before surgery, you can play an important role by reducing the number of germs on your skin.  CHG (Chlorhexidine gluconate) soap is an antiseptic cleanser which kills germs and bonds with the skin to continue killing germs even after washing.  Please do not use if you have an allergy to CHG or antibacterial soaps. If your skin becomes reddened/irritated stop using the CHG.  1. Shower the NIGHT BEFORE SURGERY and the MORNING OF SURGERY with CHG soap.  2. If you choose to wash your hair, wash your hair first as usual with your normal shampoo.  3. After shampooing, rinse your hair and body thoroughly to remove the shampoo.  4. Use CHG as you would any other liquid soap. You can apply CHG directly to the skin and wash gently with a scrungie or a clean washcloth.  5. Apply the CHG soap to your body only from the neck down. Do not use on open wounds or open sores. Avoid contact with your eyes, ears, mouth, and genitals (private parts). Wash face and genitals (private parts) with your normal soap.  6. Wash thoroughly, paying special attention to the area where your surgery will be performed.  7.  Thoroughly rinse your body with warm water.  8. Do not shower/wash with your normal soap after using and rinsing off the CHG soap.  9. Pat yourself dry with a clean towel.  10. Wear clean pajamas to bed the night before surgery.  12. Place clean sheets on your bed the night of your first shower and do not sleep with pets.  13. Shower again with the CHG soap on the day of surgery prior to arriving at the hospital.  14. Do not apply any deodorants/lotions/powders.  15. Please wear clean clothes to the hospital.

## 2023-12-14 ENCOUNTER — Encounter: Payer: Self-pay | Admitting: Surgery

## 2023-12-14 ENCOUNTER — Other Ambulatory Visit: Payer: Self-pay

## 2023-12-14 ENCOUNTER — Encounter
Admission: RE | Disposition: A | Discharge status: Home / Self Care | Payer: Self-pay | Source: Home / Self Care | Attending: Surgery

## 2023-12-14 ENCOUNTER — Encounter: Payer: Self-pay | Admitting: Anesthesiology

## 2023-12-14 ENCOUNTER — Ambulatory Visit: Payer: Self-pay | Admitting: Anesthesiology

## 2023-12-14 ENCOUNTER — Ambulatory Visit
Admission: RE | Admit: 2023-12-14 | Discharge: 2023-12-14 | Disposition: A | Discharge status: Home / Self Care | Attending: Surgery | Admitting: Surgery

## 2023-12-14 DIAGNOSIS — K439 Ventral hernia without obstruction or gangrene: Secondary | ICD-10-CM | POA: Diagnosis not present

## 2023-12-14 DIAGNOSIS — Z01818 Encounter for other preprocedural examination: Secondary | ICD-10-CM

## 2023-12-14 HISTORY — PX: EPIGASTRIC HERNIA REPAIR: SHX404

## 2023-12-14 LAB — POCT PREGNANCY, URINE: Preg Test, Ur: NEGATIVE

## 2023-12-14 SURGERY — REPAIR, HERNIA, EPIGASTRIC, ADULT
Anesthesia: General

## 2023-12-14 MED ORDER — MIDAZOLAM HCL 2 MG/2ML IJ SOLN
INTRAMUSCULAR | Status: AC
  Filled 2023-12-14: qty 2

## 2023-12-14 MED ORDER — ACETAMINOPHEN 500 MG PO TABS
ORAL_TABLET | ORAL | Status: AC
Start: 2023-12-14 — End: 2023-12-14
  Filled 2023-12-14: qty 2

## 2023-12-14 MED ORDER — CELECOXIB 200 MG PO CAPS
ORAL_CAPSULE | ORAL | Status: AC
  Filled 2023-12-14: qty 1

## 2023-12-14 MED ORDER — MIDAZOLAM HCL 2 MG/2ML IJ SOLN
INTRAMUSCULAR | Status: DC | PRN
  Administered 2023-12-14: 2 mg via INTRAVENOUS

## 2023-12-14 MED ORDER — CEFAZOLIN SODIUM-DEXTROSE 2-4 GM/100ML-% IV SOLN
2.0000 g | INTRAVENOUS | Status: AC
  Administered 2023-12-14: 2 g via INTRAVENOUS

## 2023-12-14 MED ORDER — PHENYLEPHRINE 80 MCG/ML (10ML) SYRINGE FOR IV PUSH (FOR BLOOD PRESSURE SUPPORT)
PREFILLED_SYRINGE | INTRAVENOUS | Status: AC
  Filled 2023-12-14: qty 10

## 2023-12-14 MED ORDER — PHENYLEPHRINE 80 MCG/ML (10ML) SYRINGE FOR IV PUSH (FOR BLOOD PRESSURE SUPPORT)
PREFILLED_SYRINGE | INTRAVENOUS | Status: DC | PRN
  Administered 2023-12-14: 80 ug via INTRAVENOUS

## 2023-12-14 MED ORDER — CEFAZOLIN SODIUM-DEXTROSE 2-4 GM/100ML-% IV SOLN
INTRAVENOUS | Status: AC
  Filled 2023-12-14: qty 100

## 2023-12-14 MED ORDER — EPHEDRINE SULFATE-NACL 50-0.9 MG/10ML-% IV SOSY
PREFILLED_SYRINGE | INTRAVENOUS | Status: DC | PRN
  Administered 2023-12-14: 10 mg via INTRAVENOUS

## 2023-12-14 MED ORDER — FENTANYL CITRATE (PF) 100 MCG/2ML IJ SOLN
INTRAMUSCULAR | Status: AC
  Filled 2023-12-14: qty 2

## 2023-12-14 MED ORDER — HYDROCODONE-ACETAMINOPHEN 5-325 MG PO TABS: 1.0000 | ORAL_TABLET | Freq: Four times a day (QID) | ORAL | 0 refills | Status: DC | PRN

## 2023-12-14 MED ORDER — ORAL CARE MOUTH RINSE: 15.0000 mL | Freq: Once | OROMUCOSAL | Status: AC

## 2023-12-14 MED ORDER — GABAPENTIN 300 MG PO CAPS
300.0000 mg | ORAL_CAPSULE | ORAL | Status: AC
  Administered 2023-12-14: 300 mg via ORAL

## 2023-12-14 MED ORDER — BUPIVACAINE-EPINEPHRINE (PF) 0.25% -1:200000 IJ SOLN
INTRAMUSCULAR | Status: DC | PRN
Start: 2023-12-14 — End: 2023-12-14
  Administered 2023-12-14: 10 mL

## 2023-12-14 MED ORDER — BUPIVACAINE-EPINEPHRINE (PF) 0.25% -1:200000 IJ SOLN
INTRAMUSCULAR | Status: AC
  Filled 2023-12-14: qty 30

## 2023-12-14 MED ORDER — CHLORHEXIDINE GLUCONATE CLOTH 2 % EX PADS: 6.0000 | MEDICATED_PAD | Freq: Once | CUTANEOUS | Status: DC

## 2023-12-14 MED ORDER — PROPOFOL 10 MG/ML IV BOLUS
INTRAVENOUS | Status: AC
  Filled 2023-12-14: qty 20

## 2023-12-14 MED ORDER — FENTANYL CITRATE (PF) 100 MCG/2ML IJ SOLN
INTRAMUSCULAR | Status: DC | PRN
  Administered 2023-12-14: 25 ug via INTRAVENOUS
  Administered 2023-12-14: 50 ug via INTRAVENOUS

## 2023-12-14 MED ORDER — ROCURONIUM BROMIDE 100 MG/10ML IV SOLN
INTRAVENOUS | Status: DC | PRN
  Administered 2023-12-14: 50 mg via INTRAVENOUS

## 2023-12-14 MED ORDER — GLYCOPYRROLATE 0.2 MG/ML IJ SOLN
INTRAMUSCULAR | Status: DC | PRN
  Administered 2023-12-14: .2 mg via INTRAVENOUS

## 2023-12-14 MED ORDER — LACTATED RINGERS IV SOLN: INTRAVENOUS | Status: DC | PRN

## 2023-12-14 MED ORDER — BUPIVACAINE LIPOSOME 1.3 % IJ SUSP: 20.0000 mL | Freq: Once | INTRAMUSCULAR | Status: DC

## 2023-12-14 MED ORDER — GABAPENTIN 300 MG PO CAPS
ORAL_CAPSULE | ORAL | Status: AC
Start: 2023-12-14 — End: 2023-12-14
  Filled 2023-12-14: qty 1

## 2023-12-14 MED ORDER — DEXAMETHASONE SODIUM PHOSPHATE 10 MG/ML IJ SOLN
INTRAMUSCULAR | Status: DC | PRN
Start: 2023-12-14 — End: 2023-12-14
  Administered 2023-12-14: 5 mg via INTRAVENOUS

## 2023-12-14 MED ORDER — OXYCODONE HCL 5 MG PO TABS
5.0000 mg | ORAL_TABLET | Freq: Once | ORAL | Status: AC
  Administered 2023-12-14: 5 mg via ORAL

## 2023-12-14 MED ORDER — ONDANSETRON HCL 4 MG/2ML IJ SOLN
INTRAMUSCULAR | Status: DC | PRN
  Administered 2023-12-14: 4 mg via INTRAVENOUS

## 2023-12-14 MED ORDER — EPHEDRINE 5 MG/ML INJ
INTRAVENOUS | Status: AC
  Filled 2023-12-14: qty 5

## 2023-12-14 MED ORDER — DEXAMETHASONE SODIUM PHOSPHATE 10 MG/ML IJ SOLN
INTRAMUSCULAR | Status: AC
  Filled 2023-12-14: qty 1

## 2023-12-14 MED ORDER — PROPOFOL 10 MG/ML IV BOLUS
INTRAVENOUS | Status: DC | PRN
  Administered 2023-12-14: 200 mg via INTRAVENOUS
  Administered 2023-12-14: 40 ug/kg/min via INTRAVENOUS

## 2023-12-14 MED ORDER — IBUPROFEN 200 MG PO TABS
200.0000 mg | ORAL_TABLET | Freq: Four times a day (QID) | ORAL | 2 refills | Status: AC | PRN
Start: 2023-12-14 — End: 2024-12-13

## 2023-12-14 MED ORDER — CHLORHEXIDINE GLUCONATE 0.12 % MT SOLN
15.0000 mL | Freq: Once | OROMUCOSAL | Status: AC
  Administered 2023-12-14: 15 mL via OROMUCOSAL

## 2023-12-14 MED ORDER — CHLORHEXIDINE GLUCONATE 0.12 % MT SOLN
OROMUCOSAL | Status: AC
  Filled 2023-12-14: qty 15

## 2023-12-14 MED ORDER — CELECOXIB 200 MG PO CAPS
200.0000 mg | ORAL_CAPSULE | ORAL | Status: AC
  Administered 2023-12-14: 200 mg via ORAL

## 2023-12-14 MED ORDER — LACTATED RINGERS IV SOLN: INTRAVENOUS | Status: DC

## 2023-12-14 MED ORDER — ONDANSETRON HCL 4 MG/2ML IJ SOLN
INTRAMUSCULAR | Status: AC
  Filled 2023-12-14: qty 2

## 2023-12-14 MED ORDER — LIDOCAINE HCL (PF) 2 % IJ SOLN
INTRAMUSCULAR | Status: AC
  Filled 2023-12-14: qty 5

## 2023-12-14 MED ORDER — SUGAMMADEX SODIUM 200 MG/2ML IV SOLN
INTRAVENOUS | Status: DC | PRN
Start: 2023-12-14 — End: 2023-12-14
  Administered 2023-12-14 (×2): 50 mg via INTRAVENOUS
  Administered 2023-12-14: 200 mg via INTRAVENOUS

## 2023-12-14 MED ORDER — OXYCODONE HCL 5 MG PO TABS
ORAL_TABLET | ORAL | Status: AC
  Filled 2023-12-14: qty 1

## 2023-12-14 MED ORDER — ACETAMINOPHEN 500 MG PO TABS
1000.0000 mg | ORAL_TABLET | ORAL | Status: AC
  Administered 2023-12-14: 1000 mg via ORAL

## 2023-12-14 MED ORDER — PROPOFOL 1000 MG/100ML IV EMUL
INTRAVENOUS | Status: AC
  Filled 2023-12-14: qty 100

## 2023-12-14 MED ORDER — LIDOCAINE HCL (CARDIAC) PF 100 MG/5ML IV SOSY
PREFILLED_SYRINGE | INTRAVENOUS | Status: DC | PRN
Start: 2023-12-14 — End: 2023-12-14
  Administered 2023-12-14: 50 mg via INTRAVENOUS

## 2023-12-14 MED ORDER — ROCURONIUM BROMIDE 10 MG/ML (PF) SYRINGE
PREFILLED_SYRINGE | INTRAVENOUS | Status: AC
  Filled 2023-12-14: qty 10

## 2023-12-14 SURGICAL SUPPLY — 25 items
BLADE CLIPPER SURG (BLADE) ×1 IMPLANT
CHLORAPREP W/TINT 26 (MISCELLANEOUS) IMPLANT
DERMABOND ADVANCED .7 DNX12 (GAUZE/BANDAGES/DRESSINGS) IMPLANT
DRAPE LAPAROTOMY 100X77 ABD (DRAPES) ×1 IMPLANT
ELECTRODE REM PT RTRN 9FT ADLT (ELECTROSURGICAL) ×1 IMPLANT
GAUZE 4X4 16PLY ~~LOC~~+RFID DBL (SPONGE) IMPLANT
GLOVE ORTHO TXT STRL SZ7.5 (GLOVE) ×1 IMPLANT
GOWN STRL REUS W/ TWL LRG LVL3 (GOWN DISPOSABLE) ×1 IMPLANT
GOWN STRL REUS W/ TWL XL LVL3 (GOWN DISPOSABLE) ×1 IMPLANT
KIT TURNOVER KIT A (KITS) ×1 IMPLANT
MANIFOLD NEPTUNE II (INSTRUMENTS) ×1 IMPLANT
NDL HYPO 22X1.5 SAFETY MO (MISCELLANEOUS) ×1 IMPLANT
NEEDLE HYPO 22X1.5 SAFETY MO (MISCELLANEOUS) ×1 IMPLANT
NS IRRIG 500ML POUR BTL (IV SOLUTION) ×1 IMPLANT
PACK BASIN MINOR ARMC (MISCELLANEOUS) ×1 IMPLANT
SPONGE T-LAP 18X18 ~~LOC~~+RFID (SPONGE) ×1 IMPLANT
STAPLER SKIN PROX 35W (STAPLE) IMPLANT
SUT ETHIBOND 0 MO6 C/R (SUTURE) ×1 IMPLANT
SUT VIC AB 2-0 SH 27XBRD (SUTURE) IMPLANT
SUT VIC AB 3-0 SH 27X BRD (SUTURE) ×1 IMPLANT
SUTURE MNCRL 4-0 27XMF (SUTURE) ×1 IMPLANT
SYR 20ML LL LF (SYRINGE) ×1 IMPLANT
SYR BULB IRRIG 60ML STRL (SYRINGE) ×1 IMPLANT
TRAP FLUID SMOKE EVACUATOR (MISCELLANEOUS) ×1 IMPLANT
WATER STERILE IRR 500ML POUR (IV SOLUTION) ×1 IMPLANT

## 2023-12-14 NOTE — Anesthesia Procedure Notes (Signed)
 Procedure Name: Intubation Date/Time: 12/14/2023 10:49 AM  Performed by: Lorrene Camelia LABOR, CRNAPre-anesthesia Checklist: Patient identified, Patient being monitored, Timeout performed, Emergency Drugs available and Suction available Patient Re-evaluated:Patient Re-evaluated prior to induction Oxygen Delivery Method: Circle system utilized Preoxygenation: Pre-oxygenation with 100% oxygen Induction Type: IV induction Ventilation: Mask ventilation without difficulty Laryngoscope Size: 3 and McGrath Grade View: Grade I Tube type: Oral Tube size: 7.0 mm Number of attempts: 1 Airway Equipment and Method: Stylet Placement Confirmation: ETT inserted through vocal cords under direct vision, positive ETCO2 and breath sounds checked- equal and bilateral Secured at: 21 cm Tube secured with: Tape Dental Injury: Teeth and Oropharynx as per pre-operative assessment

## 2023-12-14 NOTE — Interval H&P Note (Signed)
 History and Physical Interval Note:  12/14/2023 10:11 AM  Rebecca Powers  has presented today for surgery, with the diagnosis of Epigastric Hernia.  The various methods of treatment have been discussed with the patient and family. After consideration of risks, benefits and other options for treatment, the patient has consented to  Procedure(s): REPAIR, HERNIA, EPIGASTRIC, ADULT (N/A) as a surgical intervention.  The patient's history has been reviewed, patient examined, no change in status, stable for surgery.  I have reviewed the patient's chart and labs.  Questions were answered to the patient's satisfaction.     [name]

## 2023-12-14 NOTE — Anesthesia Preprocedure Evaluation (Signed)
 Anesthesia Evaluation  Patient identified by MRN, date of birth, ID band Patient awake    Reviewed: Allergy & Precautions, NPO status , Patient's Chart, lab work & pertinent test results  History of Anesthesia Complications Negative for: history of anesthetic complications  Airway Mallampati: III  TM Distance: >3 FB Neck ROM: full    Dental  (+) Chipped   Pulmonary neg pulmonary ROS, neg shortness of breath   Pulmonary exam normal        Cardiovascular Exercise Tolerance: Good (-) angina (-) Past MI and (-) DOE negative cardio ROS Normal cardiovascular exam     Neuro/Psych  PSYCHIATRIC DISORDERS      negative neurological ROS     GI/Hepatic negative GI ROS, Neg liver ROS,neg GERD  ,,  Endo/Other  negative endocrine ROS    Renal/GU      Musculoskeletal   Abdominal   Peds  Hematology negative hematology ROS (+)   Anesthesia Other Findings Past Medical History: No date: Allergy No date: Bipolar disorder [facility])     Comment:  Followed by Dr. Manus Dan No date: Depression No date: Epigastric hernia No date: Hyperlipidemia 05/30/2015: Thrombocytopenia (HCC)     Comment:  Pt has history of this, and has been worked up by               hematology. Cause was thought to be related to Lamictal .               Pt continues to take Lamictal  for bipolar and is stable,               therefore no changes to be made in psych meds. Hematology              recommends checking CBC with platelets every 6v months.               No need for further workup/bone marrow biopsy, unless               thrombocytopenia gets much worse.  Past Surgical History: 06/16/2021: COLONOSCOPY WITH PROPOFOL ; N/A     Comment:  Procedure: COLONOSCOPY WITH PROPOFOL ;  Surgeon: Jinny Carmine, MD;  Location: [facility] ENDOSCOPY;  Service:               Endoscopy;  Laterality: N/A; No date: HAND SURGERY; Right No date: none; N/A  BMI     Body Mass Index: 23.40 kg/m      Reproductive/Obstetrics negative OB ROS                              Anesthesia Physical Anesthesia Plan  ASA: 2  Anesthesia Plan: General ETT   Post-op Pain Management:    Induction: Intravenous  PONV Risk Score and Plan: Ondansetron , Dexamethasone , Midazolam  and Treatment may vary due to age or medical condition  Airway Management Planned: Oral ETT  Additional Equipment:   Intra-op Plan:   Post-operative Plan: Extubation in OR  Informed Consent: I have reviewed the patients History and Physical, chart, labs and discussed the procedure including the risks, benefits and alternatives for the proposed anesthesia with the patient or authorized representative who has indicated his/her understanding and acceptance.     Dental Advisory Given  Plan Discussed with: Anesthesiologist, CRNA and Surgeon  Anesthesia Plan Comments: (Patient consented for risks of anesthesia including but not limited to:  -  adverse reactions to medications - damage to eyes, teeth, lips or other oral mucosa - nerve damage due to positioning  - sore throat or hoarseness - Damage to heart, brain, nerves, lungs, other parts of body or loss of life  Patient voiced understanding and assent.)        Anesthesia Quick Evaluation

## 2023-12-14 NOTE — Anesthesia Postprocedure Evaluation (Signed)
 Anesthesia Post Note  Patient: Rebecca Powers  Procedure(s) Performed: REPAIR, HERNIA, EPIGASTRIC, ADULT  Patient location during evaluation: PACU Anesthesia Type: General Level of consciousness: awake and alert Pain management: pain level controlled Vital Signs Assessment: post-procedure vital signs reviewed and stable Respiratory status: spontaneous breathing, nonlabored ventilation and respiratory function stable Cardiovascular status: blood pressure returned to baseline and stable Postop Assessment: no apparent nausea or vomiting Anesthetic complications: no   No notable events documented.   Last Vitals:  Vitals:   12/14/23 1212 12/14/23 1233  BP: 116/68 133/79  Pulse: 93 67  Resp: 18 20  Temp: 36.9 C 37 C  SpO2: 100% 98%    Last Pain:  Vitals:   12/14/23 1233  TempSrc: Temporal  PainSc: 3                  [name]

## 2023-12-14 NOTE — Op Note (Signed)
 Repair of initial epigastric hernia, fascial defect less than 1 cm, reducible.  Pre-operative Diagnosis: Epigastric hernia  Post-operative Diagnosis: same.    Surgeon: [name], M.D., FACS  Anesthesia: General  Findings: Subcentimeter fascial defect with preperitoneal adipose in extrafascial location.  Estimated Blood Loss: 5 mL         Specimens: Preperitoneal adipose herniation through defect, excised and discarded.          Complications: none              Procedure Details  The patient was seen again in the Holding Room. The benefits, complications, treatment options, and expected outcomes were discussed with the patient. The risks of bleeding, infection, recurrence of symptoms, failure to resolve symptoms, unanticipated injury, prosthetic placement, prosthetic infection, any of which could require further surgery were reviewed with the patient. The likelihood of improving the patient's symptoms with return to their baseline status is expected.  The patient and/or family concurred with the proposed plan, giving informed consent.  The patient was taken to Operating Room, identified and the procedure verified.    Prior to the induction of general anesthesia, antibiotic prophylaxis was administered. VTE prophylaxis was in place.  General anesthesia was then administered and tolerated well. After the induction, the patient was positioned in the supine position and the abdomen was prepped with  Chloraprep and draped in the sterile fashion.  A Time Out was held and the above information confirmed.  At the location marked preoperatively local infiltration was instilled of quarter percent Marcaine  with epinephrine  a total of 10 mL was utilized in the skin and subcutaneous tissues and adjacent fascia.  Transverse incision was made, and blunt sharp dissection was completed through the subcutaneous tissues to attempt to identify the hernia sac/preperitoneal adipose.  Eventually this was  identified just inferior to the incision, circumferentially mobilized and transected at its egress through the fascial defect.  The fascial defect was closed with 2-0 Ethibond sutures in simple manner.  After confirming adequate hemostasis the dermis was reapproximated with interrupted 3-0 Vicryl, followed by running subcuticular 4-0 Monocryl.  Dermabond was applied to the skin to seal it.  Procedure was tolerated well.      [name] M.D., [facility] 12/14/2023 11:40 AM

## 2023-12-14 NOTE — Transfer of Care (Signed)
 Immediate Anesthesia Transfer of Care Note  Patient: Rebecca Powers  Procedure(s) Performed: REPAIR, HERNIA, EPIGASTRIC, ADULT  Patient Location: PACU  Anesthesia Type:General  Level of Consciousness: awake  Airway & Oxygen Therapy: Patient Spontanous Breathing  Post-op Assessment: Report given to RN, Post -op Vital signs reviewed and stable, and Patient moving all extremities  Post vital signs: Reviewed and stable  Last Vitals:  Vitals Value Taken Time  BP 117/50 12/14/23 11:45  Temp    Pulse 96 12/14/23 11:45  Resp 23 12/14/23 11:45  SpO2 100 % 12/14/23 11:45    Last Pain:  Vitals:   12/14/23 1145  TempSrc:   PainSc: 0-No pain         Complications: No notable events documented.

## 2023-12-15 ENCOUNTER — Encounter: Payer: Self-pay | Admitting: Surgery

## 2023-12-28 ENCOUNTER — Encounter: Payer: Self-pay | Admitting: Physician Assistant

## 2023-12-28 ENCOUNTER — Ambulatory Visit (INDEPENDENT_AMBULATORY_CARE_PROVIDER_SITE_OTHER): Admitting: Physician Assistant

## 2023-12-28 VITALS — BP 98/63 | HR 85 | Temp 98.0°F | Ht 66.0 in | Wt 147.4 lb

## 2023-12-28 DIAGNOSIS — K439 Ventral hernia without obstruction or gangrene: Secondary | ICD-10-CM

## 2023-12-28 DIAGNOSIS — Z09 Encounter for follow-up examination after completed treatment for conditions other than malignant neoplasm: Secondary | ICD-10-CM

## 2023-12-28 NOTE — Progress Notes (Signed)
 [facility] POST-OP OFFICE VISIT  12/28/2023  HPI: Rebecca Powers is a 48 y.o. female 14 days s/p epigastric hernia repair with Dr Lane  She is overall doing well No significant pain She does feel the incision is slightly more swollen at the end of the day and is more fatigued No fever, chills, nausea, emesis No other complaints   Vital signs: BP 98/63   Pulse 85   Temp 98 F (36.7 C) (Oral)   Ht 5' 6 (1.676 m)   Wt 147 lb 6.4 oz (66.9 kg)   SpO2 99%   BMI 23.79 kg/m    Physical Exam: Constitutional: Well appearing female, NAD Abdomen: Soft, non-tender, non-distended, no rebound/guarding Skin: Transverse incision superior to umbilicus, healing well, expected induration consistent with healing, no erythema or swelling  Assessment/Plan: This is a 48 y.o. female 14 days s/p epigastric hernia repair with Dr Lane   - Pain control prn  - Reviewed wound care recommendation  - Reviewed lifting restrictions; 4 weeks total minimum  - She can follow up on as needed basis; She understands to call with questions/concerns  -- Arthea Platt, PA-C [facility] 12/28/2023, 1:49 PM M-F: 7am - 4pm

## 2023-12-28 NOTE — Patient Instructions (Signed)

## 2024-01-30 ENCOUNTER — Ambulatory Visit
Admission: RE | Admit: 2024-01-30 | Discharge: 2024-01-30 | Disposition: A | Discharge status: Home / Self Care | Source: Ambulatory Visit | Attending: Family Medicine | Admitting: Family Medicine

## 2024-01-30 DIAGNOSIS — Z1231 Encounter for screening mammogram for malignant neoplasm of breast: Secondary | ICD-10-CM | POA: Diagnosis not present

## 2024-02-02 ENCOUNTER — Ambulatory Visit: Payer: Self-pay | Admitting: Family Medicine

## 2024-04-20 ENCOUNTER — Ambulatory Visit: Admitting: Family Medicine

## 2024-04-20 VITALS — BP 132/75 | HR 88 | Temp 98.4°F | Ht 66.0 in | Wt 153.1 lb

## 2024-04-20 DIAGNOSIS — Z1329 Encounter for screening for other suspected endocrine disorder: Secondary | ICD-10-CM | POA: Diagnosis not present

## 2024-04-20 DIAGNOSIS — L659 Nonscarring hair loss, unspecified: Secondary | ICD-10-CM | POA: Diagnosis not present

## 2024-04-20 DIAGNOSIS — E7800 Pure hypercholesterolemia, unspecified: Secondary | ICD-10-CM | POA: Diagnosis not present

## 2024-04-20 DIAGNOSIS — Z789 Other specified health status: Secondary | ICD-10-CM

## 2024-04-20 DIAGNOSIS — Z131 Encounter for screening for diabetes mellitus: Secondary | ICD-10-CM | POA: Diagnosis not present

## 2024-04-20 DIAGNOSIS — Z7689 Persons encountering health services in other specified circumstances: Secondary | ICD-10-CM

## 2024-04-20 DIAGNOSIS — Z13220 Encounter for screening for lipoid disorders: Secondary | ICD-10-CM

## 2024-04-20 DIAGNOSIS — Z130 Encounter for screening for diseases of the blood and blood-forming organs and certain disorders involving the immune mechanism: Secondary | ICD-10-CM

## 2024-04-20 DIAGNOSIS — F3176 Bipolar disorder, in full remission, most recent episode depressed: Secondary | ICD-10-CM | POA: Diagnosis not present

## 2024-04-20 DIAGNOSIS — E559 Vitamin D deficiency, unspecified: Secondary | ICD-10-CM | POA: Diagnosis not present

## 2024-04-20 NOTE — Progress Notes (Unsigned)
 "  Established Patient Office Visit  Patient ID: Rebecca Powers, female    DOB: 03/08/1976  Age: 49 y.o. MRN: [id number] PCP: Sharma Coyer, MD  Chief Complaint  Patient presents with   Establish Care    Patient is present to establish care with new PCP. Only concern mentioned is with scar tissue form hernia surgery, wanted to know ways to cosmetically improve the area  Diet is normal healthy per patient, exercise 5-6 days week on rowing machine 20-30 mins.     Subjective:     HPI  Discussed the use of AI scribe software for clinical note transcription with the patient, who gave verbal consent to proceed.  History of Present Illness Rebecca Powers is a 49 year old female who presents to transfer care and address scar tissue concerns post-hernia surgery.  She is concerned about scar tissue from hernia surgery, describing a lump at the site of the scar. She has not been massaging the area.  She has hypercholesterolemia, with her last cholesterol check approximately eleven months ago showing a total cholesterol of 248 mg/dL, HDL of 79 mg/dL, and LDL of 850 mg/dL. She is not on statins due to concerns about potential side effects, particularly on the liver, and is managing her cholesterol with lifestyle modifications, including a normal diet, regular exercise on a rowing machine five to six days a week for 20-30 minutes, and omega-3 supplements at a dose of 2400 mg daily. She has a family history of hypercholesterolemia, with her mother and uncle also affected. Her mother is on a low-dose statin, and her uncle manages his condition with lifestyle changes.  She experiences hair loss, previously discussed with a dermatologist who recommended topical Rogaine. She is considering switching to a different shampoo and has been using a scalp massager.  She has bipolar type II disorder, well-controlled with lamotrigine  50 mg twice daily. She sees her psychiatrist twice a year and reports  no recent incidents of suicidal thoughts.  She has never been sexually active and questions the necessity of regular Pap smears. Her last Pap smear was in 2019.  She experienced a cough following an illness before Christmas, which has improved but occasionally persists in cold weather.   Patient Active Problem List   Diagnosis Date Noted   Uncomplicated epigastric hernia 12/06/2023   Encounter for screening mammogram for malignant neoplasm of breast 05/11/2022   Encounter for surveillance of contraceptive pills 05/11/2022   Hair loss 05/11/2022   Annual physical exam 05/08/2021   Colon cancer screening 05/08/2021   Allergic rhinitis 05/30/2015   Excess, menstruation 05/30/2015   Pure hypercholesterolemia 05/30/2015   Avitaminosis D 05/30/2015   Bipolar affective disorder (HCC) 06/02/2006   Clinical depression 11/09/2000   Acne erythematosa 07/22/1999      ROS    Objective:     BP 132/75 (BP Location: Left Arm, Patient Position: Sitting, Cuff Size: Normal)   Pulse 88   Temp 98.4 F (36.9 C) (Oral)   Ht 5' 6 (1.676 m)   Wt 153 lb 1.6 oz (69.4 kg)   LMP 04/01/2024 (Approximate)   SpO2 100%   BMI 24.71 kg/m   BP Readings from Last 3 Encounters:  04/20/24 132/75  12/28/23 98/63  12/14/23 133/79   Wt Readings from Last 3 Encounters:  04/20/24 153 lb 1.6 oz (69.4 kg)  12/28/23 147 lb 6.4 oz (66.9 kg)  12/14/23 145 lb (65.8 kg)      Physical Exam Vitals reviewed.  Constitutional:  General: She is not in acute distress.    Appearance: Normal appearance. She is not ill-appearing.  Cardiovascular:     Rate and Rhythm: Normal rate and regular rhythm.  Pulmonary:     Effort: Pulmonary effort is normal. No respiratory distress.     Breath sounds: No wheezing, rhonchi or rales.  Neurological:     Mental Status: She is alert and oriented to person, place, and time.  Psychiatric:        Mood and Affect: Mood normal.        Behavior: Behavior  normal.     {PhysExam Abridge (Optional):[id number]} No results found for any visits on 04/20/24.  Last metabolic panel Lab Results  Component Value Date   GLUCOSE 85 05/19/2023   NA 138 05/19/2023   K 4.3 05/19/2023   CL 101 05/19/2023   CO2 22 05/19/2023   BUN 8 05/19/2023   CREATININE 0.84 05/19/2023   EGFR 87 05/19/2023   CALCIUM 9.4 05/19/2023   PHOS 3.4 09/15/2016   PROT 6.8 05/19/2023   ALBUMIN 4.2 05/19/2023   LABGLOB 2.6 05/19/2023   AGRATIO 2.0 05/13/2022   BILITOT 0.4 05/19/2023   ALKPHOS 51 05/19/2023   AST 32 05/19/2023   ALT 23 05/19/2023   Last lipids Lab Results  Component Value Date   CHOL 248 (H) 05/19/2023   HDL 79 05/19/2023   LDLCALC 149 (H) 05/19/2023   TRIG 118 05/19/2023   CHOLHDL 3.1 05/19/2023      The 10-year ASCVD risk score (Arnett DK, et al., 2019) is: 0.8%    Assessment & Plan:   Problem List Items Addressed This Visit     Pure hypercholesterolemia - Primary   Other Visit Diagnoses       Hepatitis B vaccination status unknown         Encounter to establish care with new provider           Assessment and Plan Assessment & Plan Pure hypercholesterolemia Hypercholesterolemia with total cholesterol of 248 mg/dL, HDL of 79 mg/dL, and LDL of 850 mg/dL. She is not on statins due to concerns about liver impact and prefers lifestyle modifications and omega-3 supplements. Discussed potential benefits of statins in reducing cardiovascular risk and alternative options like red yeast rice. - Ordered lipid panel - Recommended red yeast rice as an alternative to statins - Continue omega-3 supplements  Bipolar disorder, in full remission, most recent episode depressed Bipolar disorder type 2 in full remission with no recent suicidal ideation. Managed with lamotrigine  250 mg daily. Regular follow-ups with psychiatrist Dr. Aleene Dan. - Continue lamotrigine  250 mg daily - Continue follow-up with psychiatrist Dr. Aleene Dan  Nonscarring hair loss Mild nonscarring hair loss. Dermatologist recommended topical Rogaine, but she is hesitant due to commitment required. Considering natural shampoo and scalp massage as alternatives. - Consider topical Rogaine if committed - Try natural shampoo and scalp massage  Vitamin D  deficiency Vitamin D  levels checked in February. - Will check vitamin D  levels in three months  General Health Maintenance Discussed Pap smear necessity due to low HPV risk and no family history of cervical cancer. She prefers to avoid Pap smear. Discussed general health maintenance including exercise and diet. - Ordered full panel of labs including anemia, diabetes, cholesterol, platelet levels, thyroid, and vitamin D  - Scheduled physical exam in three months    No follow-ups on file.    Rockie Agent, MD [facility]   "

## 2024-04-20 NOTE — Patient Instructions (Signed)
 To keep you healthy, please keep in mind the following health maintenance items that you are due for:   Health Maintenance Due  Topic Date Due   COVID-19 Vaccine (1) Never done   Hepatitis B Vaccines 19-59 Average Risk (1 of 3 - 19+ 3-dose series) Never done   Cervical Cancer Screening (HPV/Pap Cotest)  10/18/2022     Best Wishes,   Dr. Lang

## 2024-05-01 ENCOUNTER — Encounter: Admitting: Family Medicine

## 2024-05-04 ENCOUNTER — Other Ambulatory Visit: Payer: Self-pay

## 2024-05-04 DIAGNOSIS — Z131 Encounter for screening for diabetes mellitus: Secondary | ICD-10-CM

## 2024-05-04 DIAGNOSIS — Z130 Encounter for screening for diseases of the blood and blood-forming organs and certain disorders involving the immune mechanism: Secondary | ICD-10-CM

## 2024-05-04 DIAGNOSIS — Z1329 Encounter for screening for other suspected endocrine disorder: Secondary | ICD-10-CM

## 2024-05-04 DIAGNOSIS — Z13220 Encounter for screening for lipoid disorders: Secondary | ICD-10-CM

## 2024-05-04 DIAGNOSIS — E7800 Pure hypercholesterolemia, unspecified: Secondary | ICD-10-CM

## 2024-05-04 DIAGNOSIS — L659 Nonscarring hair loss, unspecified: Secondary | ICD-10-CM

## 2024-05-14 ENCOUNTER — Encounter: Payer: Self-pay | Admitting: Family Medicine

## 2024-05-17 ENCOUNTER — Other Ambulatory Visit: Payer: Self-pay

## 2024-05-17 DIAGNOSIS — L659 Nonscarring hair loss, unspecified: Secondary | ICD-10-CM

## 2024-05-17 DIAGNOSIS — Z131 Encounter for screening for diabetes mellitus: Secondary | ICD-10-CM

## 2024-05-17 DIAGNOSIS — Z130 Encounter for screening for diseases of the blood and blood-forming organs and certain disorders involving the immune mechanism: Secondary | ICD-10-CM

## 2024-05-17 DIAGNOSIS — E7800 Pure hypercholesterolemia, unspecified: Secondary | ICD-10-CM

## 2024-05-17 DIAGNOSIS — Z13220 Encounter for screening for lipoid disorders: Secondary | ICD-10-CM

## 2024-05-17 DIAGNOSIS — Z1329 Encounter for screening for other suspected endocrine disorder: Secondary | ICD-10-CM

## 2024-05-18 LAB — CMP14+EGFR
ALT: 21 [IU]/L (ref 0–32)
AST: 26 [IU]/L (ref 0–40)
Albumin: 4.5 g/dL (ref 3.9–4.9)
Alkaline Phosphatase: 43 [IU]/L (ref 41–116)
BUN/Creatinine Ratio: 13 (ref 9–23)
BUN: 11 mg/dL (ref 6–24)
Bilirubin Total: 0.5 mg/dL (ref 0.0–1.2)
CO2: 21 mmol/L (ref 20–29)
Calcium: 9.2 mg/dL (ref 8.7–10.2)
Chloride: 103 mmol/L (ref 96–106)
Creatinine, Ser: 0.87 mg/dL (ref 0.57–1.00)
Globulin, Total: 2.7 g/dL (ref 1.5–4.5)
Glucose: 93 mg/dL (ref 70–99)
Potassium: 4.3 mmol/L (ref 3.5–5.2)
Sodium: 139 mmol/L (ref 134–144)
Total Protein: 7.2 g/dL (ref 6.0–8.5)
eGFR: 83 mL/min/{1.73_m2}

## 2024-05-18 LAB — HEMOGLOBIN A1C
Est. average glucose Bld gHb Est-mCnc: 103 mg/dL
Hgb A1c MFr Bld: 5.2 % (ref 4.8–5.6)

## 2024-05-18 LAB — CBC
Hematocrit: 41.7 % (ref 34.0–46.6)
Hemoglobin: 13.5 g/dL (ref 11.1–15.9)
MCH: 30.8 pg (ref 26.6–33.0)
MCHC: 32.4 g/dL (ref 31.5–35.7)
MCV: 95 fL (ref 79–97)
Platelets: 191 10*3/uL (ref 150–450)
RBC: 4.38 x10E6/uL (ref 3.77–5.28)
RDW: 11.7 % (ref 11.7–15.4)
WBC: 6.1 10*3/uL (ref 3.4–10.8)

## 2024-05-18 LAB — LIPID PANEL
Chol/HDL Ratio: 2.9 ratio (ref 0.0–4.4)
Cholesterol, Total: 250 mg/dL — ABNORMAL HIGH (ref 100–199)
HDL: 85 mg/dL
LDL Chol Calc (NIH): 147 mg/dL — ABNORMAL HIGH (ref 0–99)
Triglycerides: 104 mg/dL (ref 0–149)
VLDL Cholesterol Cal: 18 mg/dL (ref 5–40)

## 2024-05-18 LAB — TSH+FREE T4
Free T4: 1.24 ng/dL (ref 0.82–1.77)
TSH: 4.14 u[IU]/mL (ref 0.450–4.500)

## 2024-05-18 LAB — HEPATITIS B SURFACE ANTIBODY,QUALITATIVE: Hep B Surface Ab, Qual: NONREACTIVE

## 2024-05-22 ENCOUNTER — Ambulatory Visit: Payer: BC Managed Care – PPO | Admitting: Dermatology

## 2024-07-12 ENCOUNTER — Encounter: Admitting: Family Medicine

## 2024-07-19 ENCOUNTER — Ambulatory Visit: Admitting: Family Medicine
